# Patient Record
Sex: Male | Born: 1950
Health system: Southern US, Community
[De-identification: ages and names within clinical notes are randomized; demographics above are authoritative.]

## PROBLEM LIST (undated history)

## (undated) ENCOUNTER — Emergency Department (HOSPITAL_COMMUNITY): Admission: EM | Payer: Medicare Other | Source: Home / Self Care

## (undated) DIAGNOSIS — R7303 Prediabetes: Secondary | ICD-10-CM

## (undated) DIAGNOSIS — M1990 Unspecified osteoarthritis, unspecified site: Secondary | ICD-10-CM

## (undated) DIAGNOSIS — I10 Essential (primary) hypertension: Secondary | ICD-10-CM

## (undated) DIAGNOSIS — G8929 Other chronic pain: Secondary | ICD-10-CM

## (undated) DIAGNOSIS — G4730 Sleep apnea, unspecified: Secondary | ICD-10-CM

## (undated) DIAGNOSIS — E039 Hypothyroidism, unspecified: Secondary | ICD-10-CM

## (undated) DIAGNOSIS — E7800 Pure hypercholesterolemia, unspecified: Secondary | ICD-10-CM

## (undated) DIAGNOSIS — M549 Dorsalgia, unspecified: Secondary | ICD-10-CM

## (undated) DIAGNOSIS — F32A Depression, unspecified: Secondary | ICD-10-CM

## (undated) DIAGNOSIS — N2889 Other specified disorders of kidney and ureter: Secondary | ICD-10-CM

## (undated) DIAGNOSIS — D649 Anemia, unspecified: Secondary | ICD-10-CM

## (undated) HISTORY — PX: FOOT SURGERY: SHX648

## (undated) HISTORY — PX: TOTAL THYROIDECTOMY: SHX2547

## (undated) HISTORY — PX: SHOULDER SURGERY: SHX246

## (undated) HISTORY — PX: HAND SURGERY: SHX662

## (undated) HISTORY — PX: OTHER SURGICAL HISTORY: SHX169

---

## 1998-02-07 HISTORY — PX: EYE SURGERY: SHX253

## 2014-03-06 ENCOUNTER — Emergency Department (HOSPITAL_COMMUNITY)
Admission: EM | Admit: 2014-03-06 | Discharge: 2014-03-06 | Disposition: A | Discharge status: Home / Self Care | Attending: Emergency Medicine | Admitting: Emergency Medicine

## 2014-03-06 ENCOUNTER — Emergency Department (HOSPITAL_COMMUNITY)

## 2014-03-06 ENCOUNTER — Encounter (HOSPITAL_COMMUNITY): Payer: Self-pay | Admitting: Emergency Medicine

## 2014-03-06 DIAGNOSIS — Z87891 Personal history of nicotine dependence: Secondary | ICD-10-CM | POA: Insufficient documentation

## 2014-03-06 DIAGNOSIS — I10 Essential (primary) hypertension: Secondary | ICD-10-CM | POA: Diagnosis not present

## 2014-03-06 DIAGNOSIS — R079 Chest pain, unspecified: Secondary | ICD-10-CM | POA: Diagnosis not present

## 2014-03-06 HISTORY — DX: Essential (primary) hypertension: I10

## 2014-03-06 LAB — COMPREHENSIVE METABOLIC PANEL
ALT: 16 U/L (ref 0–53)
AST: 24 U/L (ref 0–37)
Albumin: 4.7 g/dL (ref 3.5–5.2)
Alkaline Phosphatase: 49 U/L (ref 39–117)
Anion gap: 7 (ref 5–15)
BUN: 28 mg/dL — ABNORMAL HIGH (ref 6–23)
CO2: 29 mmol/L (ref 19–32)
Calcium: 9.5 mg/dL (ref 8.4–10.5)
Chloride: 102 mmol/L (ref 96–112)
Creatinine, Ser: 0.98 mg/dL (ref 0.50–1.35)
GFR calc Af Amer: >90 mL/min (ref >90)
GFR calc non Af Amer: 86 mL/min — ABNORMAL LOW (ref >90)
Glucose, Bld: 110 mg/dL — ABNORMAL HIGH (ref 70–99)
Potassium: 3.8 mmol/L (ref 3.5–5.1)
Sodium: 138 mmol/L (ref 135–145)
Total Bilirubin: 0.6 mg/dL (ref 0.3–1.2)
Total Protein: 7.9 g/dL (ref 6.0–8.3)

## 2014-03-06 LAB — CBC WITH DIFFERENTIAL/PLATELET
Basophils Absolute: 0 10*3/uL (ref 0.0–0.1)
Basophils Relative: 0 % (ref 0–1)
Eosinophils Absolute: 0.1 10*3/uL (ref 0.0–0.7)
Eosinophils Relative: 2 % (ref 0–5)
HCT: 38.6 % — ABNORMAL LOW (ref 39.0–52.0)
Hemoglobin: 13.2 g/dL (ref 13.0–17.0)
Lymphocytes Relative: 13 % (ref 12–46)
Lymphs Abs: 0.6 10*3/uL — ABNORMAL LOW (ref 0.7–4.0)
MCH: 31.8 pg (ref 26.0–34.0)
MCHC: 34.2 g/dL (ref 30.0–36.0)
MCV: 93 fL (ref 78.0–100.0)
Monocytes Absolute: 0.3 10*3/uL (ref 0.1–1.0)
Monocytes Relative: 8 % (ref 3–12)
Neutro Abs: 3.2 10*3/uL (ref 1.7–7.7)
Neutrophils Relative %: 77 % (ref 43–77)
Platelets: 148 10*3/uL — ABNORMAL LOW (ref 150–400)
RBC: 4.15 MIL/uL — ABNORMAL LOW (ref 4.22–5.81)
RDW: 13.8 % (ref 11.5–15.5)
WBC: 4.1 10*3/uL (ref 4.0–10.5)

## 2014-03-06 LAB — TROPONIN I: Troponin I: <0.03 ng/mL (ref <0.031)

## 2014-03-06 LAB — LIPASE, BLOOD: Lipase: 34 U/L (ref 11–59)

## 2014-03-06 MED ORDER — KETOROLAC TROMETHAMINE 30 MG/ML IJ SOLN
15.0000 mg | Freq: Once | INTRAMUSCULAR | Status: AC
  Administered 2014-03-06: 15 mg via INTRAVENOUS
  Filled 2014-03-06: qty 1

## 2014-03-06 MED ORDER — SODIUM CHLORIDE 0.9 % IV BOLUS (SEPSIS)
1000.0000 mL | Freq: Once | INTRAVENOUS | Status: AC
  Administered 2014-03-06: 1000 mL via INTRAVENOUS

## 2014-03-06 MED ORDER — IOHEXOL 300 MG/ML  SOLN
80.0000 mL | Freq: Once | INTRAMUSCULAR | Status: AC | PRN
  Administered 2014-03-06: 80 mL via INTRAVENOUS

## 2014-03-06 NOTE — ED Notes (Signed)
Having chest pain for last hour.  Pain increase and SOB on arrival.  Rates pain 7.

## 2014-03-06 NOTE — Discharge Instructions (Signed)

## 2014-03-06 NOTE — ED Notes (Signed)
Discharge instructions reviewed with pt, questions answered. Pt verbalized understanding.  

## 2014-03-06 NOTE — ED Notes (Signed)
Pt assessed by myself, nurse first. Pt c/o of left side chest pain which he states feels like "a heartbeat", pt states pain worse with coughing, deep breathing. Pt in NAD. No concern for acute MI at this time.

## 2014-03-06 NOTE — ED Provider Notes (Signed)
CSN: [id number]     Arrival date & time 03/06/14  1056 History  This chart was scribed for Raeford Razor, MD by Luisa Dago, ED Scribe. This patient was seen in room APA12/APA12 and the patient's care was started at 12:09 PM.    Chief Complaint  Patient presents with  . Chest Pain   Patient is a 64 y.o. male presenting with chest pain. The history is provided by the patient and medical records. No language interpreter was used.  Chest Pain Associated symptoms: no abdominal pain, no back pain, no cough, no fatigue and no headache    HPI Comments: Douglas Hardy is a 64 y.o. male with a PMhx of HTN listed below presents to the Emergency Department complaining of sudden onset gradually worsening left sided chest pain that started 4 hours ago. Pt states that his pain is exacerbated by deep breathing and coughing. Positive associated SOB walking here from the car, but it has currently resolved. He states that when it started he could reproduce the pain by pushing on his chest. Now, states that pain is also worse with rotation of trunk to the left, discomfort not present when he twists to the right. Pain not as bad as when it started, but still present.  Pt endorses a h/o sarcoidosis. Douglas Hardy has not smoked tobacco in 40 plus years. Pt denies fever, neck pain, sore throat, visual disturbance, cough,  abdominal pain, nausea, emesis, diarrhea, urinary symptoms, back pain, HA, weakness, numbness and rash as associated symptoms.    Pt has been checking his BP recently at home. He states that his BP has been fluctuating, from 145/101 down to 60/45. Wife states that he has been feeling dizzy and light-headed. Pt is on 4 medications for his BP.   Past Medical History  Diagnosis Date  . Hypertension    History reviewed. No pertinent past surgical history. Family History  Problem Relation Age of Onset  . Hyperlipidemia Mother   . Hyperlipidemia Father   . Hyperlipidemia Sister   . Cancer Brother   .  Stroke Brother   . Hyperlipidemia Brother    History  Substance Use Topics  . Smoking status: Former Games developer  . Smokeless tobacco: Not on file  . Alcohol Use: 0.6 oz/week    1 Glasses of wine per week    Review of Systems  Constitutional: Negative for appetite change and fatigue.  HENT: Negative for congestion, ear discharge and sinus pressure.   Eyes: Negative for discharge.  Respiratory: Negative for cough.   Cardiovascular: Positive for chest pain.  Gastrointestinal: Negative for abdominal pain and diarrhea.  Genitourinary: Negative for frequency and hematuria.  Musculoskeletal: Negative for back pain.  Skin: Negative for rash.  Neurological: Negative for seizures and headaches.  Psychiatric/Behavioral: Negative for hallucinations.  All other systems reviewed and are negative.     Allergies  Celebrex and Lisinopril  Home Medications   Prior to Admission medications   Not on File   BP 103/53 mmHg  Pulse 84  Temp(Src) 98.8 F (37.1 C)  Resp 18  Ht  (1.702 m)  Wt 179 lb (81.194 kg)  BMI 28.03 kg/m2  SpO2 100%  Physical Exam  Constitutional: He appears well-developed and well-nourished. No distress.  HENT:  Head: Normocephalic and atraumatic.  Eyes: Conjunctivae are normal. Right eye exhibits no discharge. Left eye exhibits no discharge.  Neck: Neck supple.  Cardiovascular: Normal rate, regular rhythm and normal heart sounds.  Exam reveals no gallop and no  friction rub.   No murmur heard. Pulmonary/Chest: Effort normal and breath sounds normal. No respiratory distress.  Chest non-tender. Negative chest wall tenderness. Pain not reproducible with palpation.   Abdominal: Soft. He exhibits no distension. There is no tenderness.  Musculoskeletal: He exhibits no edema or tenderness.  Neurological: He is alert.  Skin: Skin is warm and dry.  Psychiatric: He has a normal mood and affect. His behavior is normal. Thought content normal.  Nursing note and vitals  reviewed.   ED Course  Procedures (including critical care time)  DIAGNOSTIC STUDIES: Oxygen Saturation is 100% on RA, normal by my interpretation.    COORDINATION OF CARE: 12:16 PM- Pt advised of plan for treatment and pt agrees.  Labs Review Labs Reviewed  CBC WITH DIFFERENTIAL/PLATELET - Abnormal; Notable for the following:    RBC 4.15 (*)    HCT 38.6 (*)    Platelets 148 (*)    Lymphs Abs 0.6 (*)    All other components within normal limits  COMPREHENSIVE METABOLIC PANEL - Abnormal; Notable for the following:    Glucose, Bld 110 (*)    BUN 28 (*)    GFR calc non Af Amer 86 (*)    All other components within normal limits  TROPONIN I  LIPASE, BLOOD    Imaging Review No results found.   Dg Chest 2 View  03/06/2014   CLINICAL DATA:  Acute left-sided chest pain for 4 hr. Cough. Shortness of breath.  EXAM: CHEST  2 VIEW  COMPARISON:  None.  FINDINGS: The heart size and mediastinal contours are within normal limits. No evidence of pulmonary infiltrate. No evidence of pneumothorax or pleural effusion.  An asymmetric nodular opacity is seen in the left perihilar region. Small carcinoma cannot be excluded. Mild elevation of left hemidiaphragm noted. The visualized skeletal structures are unremarkable.  IMPRESSION: No acute findings.  Small nodular opacity in left perihilar region. Small carcinoma cannot be excluded. Chest CT with contrast is recommended for further evaluation.   Electronically Signed   By: Myles Rosenthal M.D.   On: 03/06/2014 12:33   Ct Chest W Contrast  03/06/2014   CLINICAL DATA:  Nodular opacity on chest radiograph obtained earlier in the day. Patient complains of chest pain. History of sarcoidosis.  EXAM: CT CHEST WITH CONTRAST  TECHNIQUE: Multidetector CT imaging of the chest was performed during intravenous contrast administration.  CONTRAST:  80mL OMNIPAQUE IOHEXOL 300 MG/ML  SOLN  COMPARISON:  Chest radiograph March 06, 2014  FINDINGS: There is no nodular  opacity in the left perihilar region on CT did correspond to the area of concern on chest radiograph obtained earlier in the day.  On axial slice 20 series 3, there is a 3 mm nodular opacity anterior segment of the right upper lobe. On axial slice 21 series 3, there is a 4 mm nodular opacity abutting the pleura posteriorly in the superior segment of the right lower lobe. On axial slice 22 series 3, there is a 2 mm nodular opacity abutting the pleura in the posterior aspect of the superior segment of the right lower lobe. Also on slice 22, there is a 2 mm nodular opacity abutting the pleura in the medial aspect of the superior segment of the right lower lobe. On axial slice 27 series 3, there are several 2-3 mm nodular opacities in the lateral segment of the right middle lobe. On axial slice 29 series 3, there is a 5 mm nodular opacity in the lateral segment  of the right middle lobe. On axial slice 29 series 3, there is a 3 mm nodular opacity in the superior segment of the left lower lobe. There is no lung edema or consolidation.  There are calcified mildly enlarged lymph nodes in each hilum as well as in the sub- carinal region. The sub- carinal adenopathy measures 3.6 x 2.6 cm. The largest individual lymph node in the left hilar region measures 1.6 by 1.4 cm. The largest individual lymph node in the right hilum measures 2.4 x 2.1 cm. There is a pretracheal mildly enlarged lymph node measuring 2.1 x 1.4 cm with subtle calcification. There is a right peritracheal lymph node measuring 2.6 x 1.5 cm.  Pericardium is not thickened. There is no thoracic aortic aneurysm. No pulmonary embolus is appreciable on this study.  In the visualized upper abdomen, no lesion is seen. There are no blastic or lytic bone lesions. There is a 1 cm nodular area in the right lobe of the thyroid.  IMPRESSION: No mass corresponding to the area question on chest radiograph earlier in the day. Suspect that this opacity in the left perihilar  region represents a prominent peripheral vessel.  There are scattered small pulmonary nodular lesions in the lungs, largest measuring 5 mm. Followup of these nodular lesion should be based on Fleischner Society guidelines. If the patient is at high risk for bronchogenic carcinoma, follow-up chest CT at 6-12 months is recommended. If the patient is at low risk for bronchogenic carcinoma, follow-up chest CT at 12 months is recommended. This recommendation follows the consensus statement: Guidelines for Management of Small Pulmonary Nodules Detected on CT Scans: A Statement from the Fleischner Society as published in Radiology 2005;237:395-400.  Multiple enlarged lymph nodes, many of which are calcified. Suspect adenopathy secondary to sarcoidosis. Particular attention of these lymph nodes on follow-up CT advised. See above recommendation.  Small nodular lesion right lobe of thyroid. A lesion of this size in a patient of this age group does not warrant additional imaging surveillance beyond attention followup CT as per recommended above.   Electronically Signed   By: Bretta BangWilliam  Woodruff M.D.   On: 03/06/2014 13:51    EKG Interpretation   Date/Time:  Thursday March 06 2014 11:04:23 EST Ventricular Rate:  86 PR Interval:  184 QRS Duration: 84 QT Interval:  360 QTC Calculation: 430 R Axis:   46 Text Interpretation:  Sinus rhythm with occasional Premature ventricular  complexes Non-specific ST-t changes Confirmed by Juleen ChinaKOHUT  MD,  ([id number])  on 03/06/2014 2:01:08 PM      MDM   Final diagnoses:  Chest pain, unspecified chest pain type    63yM with CP. Atypical for ACS. Doubt PE, infectious, dissection. Hx of sarcoidosis. CT findings likely represent this.   I personally preformed the services scribed in my presence. The recorded information has been reviewed is accurate. Raeford RazorStephen , MD.    Raeford RazorStephen , MD 03/11/14 [phone]

## 2014-10-15 ENCOUNTER — Encounter (HOSPITAL_COMMUNITY): Payer: Self-pay | Admitting: *Deleted

## 2014-10-15 ENCOUNTER — Emergency Department (HOSPITAL_COMMUNITY)

## 2014-10-15 ENCOUNTER — Emergency Department (HOSPITAL_COMMUNITY)
Admission: EM | Admit: 2014-10-15 | Discharge: 2014-10-15 | Disposition: A | Discharge status: Home / Self Care | Attending: Emergency Medicine | Admitting: Emergency Medicine

## 2014-10-15 DIAGNOSIS — R350 Frequency of micturition: Secondary | ICD-10-CM | POA: Insufficient documentation

## 2014-10-15 DIAGNOSIS — G8929 Other chronic pain: Secondary | ICD-10-CM | POA: Insufficient documentation

## 2014-10-15 DIAGNOSIS — M545 Low back pain, unspecified: Secondary | ICD-10-CM

## 2014-10-15 DIAGNOSIS — R109 Unspecified abdominal pain: Secondary | ICD-10-CM

## 2014-10-15 DIAGNOSIS — M549 Dorsalgia, unspecified: Secondary | ICD-10-CM

## 2014-10-15 DIAGNOSIS — Z7982 Long term (current) use of aspirin: Secondary | ICD-10-CM | POA: Diagnosis not present

## 2014-10-15 DIAGNOSIS — I10 Essential (primary) hypertension: Secondary | ICD-10-CM | POA: Diagnosis not present

## 2014-10-15 DIAGNOSIS — Z79899 Other long term (current) drug therapy: Secondary | ICD-10-CM | POA: Diagnosis not present

## 2014-10-15 DIAGNOSIS — Z87891 Personal history of nicotine dependence: Secondary | ICD-10-CM | POA: Diagnosis not present

## 2014-10-15 HISTORY — DX: Other chronic pain: G89.29

## 2014-10-15 HISTORY — DX: Dorsalgia, unspecified: M54.9

## 2014-10-15 LAB — COMPREHENSIVE METABOLIC PANEL
ALBUMIN: 4 g/dL (ref 3.5–5.0)
ALK PHOS: 40 U/L (ref 38–126)
ALT: 12 U/L — AB (ref 17–63)
ANION GAP: 9 (ref 5–15)
AST: 21 U/L (ref 15–41)
BILIRUBIN TOTAL: 0.8 mg/dL (ref 0.3–1.2)
BUN: 28 mg/dL — ABNORMAL HIGH (ref 6–20)
CALCIUM: 8.7 mg/dL — AB (ref 8.9–10.3)
CO2: 28 mmol/L (ref 22–32)
Chloride: 100 mmol/L — ABNORMAL LOW (ref 101–111)
Creatinine, Ser: 1.34 mg/dL — ABNORMAL HIGH (ref 0.61–1.24)
GFR calc Af Amer: >60 mL/min (ref >60)
GFR calc non Af Amer: 54 mL/min — ABNORMAL LOW (ref >60)
GLUCOSE: 102 mg/dL — AB (ref 65–99)
Potassium: 4 mmol/L (ref 3.5–5.1)
Sodium: 137 mmol/L (ref 135–145)
TOTAL PROTEIN: 6.9 g/dL (ref 6.5–8.1)

## 2014-10-15 LAB — URINALYSIS, ROUTINE W REFLEX MICROSCOPIC
BILIRUBIN URINE: NEGATIVE
GLUCOSE, UA: NEGATIVE mg/dL
HGB URINE DIPSTICK: NEGATIVE
KETONES UR: NEGATIVE mg/dL
Leukocytes, UA: NEGATIVE
Nitrite: NEGATIVE
PROTEIN: NEGATIVE mg/dL
Specific Gravity, Urine: 1.01 (ref 1.005–1.030)
UROBILINOGEN UA: 0.2 mg/dL (ref 0.0–1.0)
pH: 6.5 (ref 5.0–8.0)

## 2014-10-15 LAB — CBC WITH DIFFERENTIAL/PLATELET
BASOS PCT: 0 % (ref 0–1)
Basophils Absolute: 0 10*3/uL (ref 0.0–0.1)
EOS ABS: 0.1 10*3/uL (ref 0.0–0.7)
EOS PCT: 3 % (ref 0–5)
HCT: 35 % — ABNORMAL LOW (ref 39.0–52.0)
Hemoglobin: 12.1 g/dL — ABNORMAL LOW (ref 13.0–17.0)
LYMPHS ABS: 0.6 10*3/uL — AB (ref 0.7–4.0)
Lymphocytes Relative: 18 % (ref 12–46)
MCH: 31.5 pg (ref 26.0–34.0)
MCHC: 34.6 g/dL (ref 30.0–36.0)
MCV: 91.1 fL (ref 78.0–100.0)
MONOS PCT: 13 % — AB (ref 3–12)
Monocytes Absolute: 0.5 10*3/uL (ref 0.1–1.0)
NEUTROS PCT: 66 % (ref 43–77)
Neutro Abs: 2.3 10*3/uL (ref 1.7–7.7)
PLATELETS: 140 10*3/uL — AB (ref 150–400)
RBC: 3.84 MIL/uL — ABNORMAL LOW (ref 4.22–5.81)
RDW: 13.7 % (ref 11.5–15.5)
WBC: 3.5 10*3/uL — ABNORMAL LOW (ref 4.0–10.5)

## 2014-10-15 LAB — LIPASE, BLOOD: Lipase: 20 U/L — ABNORMAL LOW (ref 22–51)

## 2014-10-15 LAB — I-STAT CG4 LACTIC ACID, ED
LACTIC ACID, VENOUS: 1.29 mmol/L (ref 0.5–2.0)
LACTIC ACID, VENOUS: 0.53 mmol/L (ref 0.5–2.0)

## 2014-10-15 LAB — TROPONIN I

## 2014-10-15 MED ORDER — IOHEXOL 300 MG/ML  SOLN
25.0000 mL | Freq: Once | INTRAMUSCULAR | Status: AC | PRN
  Administered 2014-10-15: 25 mL via ORAL

## 2014-10-15 MED ORDER — SODIUM CHLORIDE 0.9 % IV SOLN
INTRAVENOUS | Status: DC
  Administered 2014-10-15: 100 mL/h via INTRAVENOUS

## 2014-10-15 MED ORDER — HYDROCODONE-ACETAMINOPHEN 5-325 MG PO TABS: ORAL_TABLET | ORAL | Status: DC

## 2014-10-15 MED ORDER — METHOCARBAMOL 500 MG PO TABS
500.0000 mg | ORAL_TABLET | Freq: Two times a day (BID) | ORAL | Status: DC | PRN
Start: 2014-10-15 — End: 2018-10-12

## 2014-10-15 MED ORDER — SODIUM CHLORIDE 0.9 % IV BOLUS (SEPSIS)
500.0000 mL | Freq: Once | INTRAVENOUS | Status: AC
Start: 2014-10-15 — End: 2014-10-15
  Administered 2014-10-15: 500 mL via INTRAVENOUS

## 2014-10-15 MED ORDER — IOHEXOL 300 MG/ML  SOLN
100.0000 mL | Freq: Once | INTRAMUSCULAR | Status: AC | PRN
  Administered 2014-10-15: 100 mL via INTRAVENOUS

## 2014-10-15 MED ORDER — SODIUM CHLORIDE 0.9 % IV BOLUS (SEPSIS)
500.0000 mL | Freq: Once | INTRAVENOUS | Status: AC
  Administered 2014-10-15: 500 mL via INTRAVENOUS

## 2014-10-15 MED ORDER — SODIUM CHLORIDE 0.9 % IV BOLUS (SEPSIS)
1000.0000 mL | Freq: Once | INTRAVENOUS | Status: AC
  Administered 2014-10-15: 1000 mL via INTRAVENOUS

## 2014-10-15 NOTE — ED Notes (Signed)
mD at bedside

## 2014-10-15 NOTE — Discharge Instructions (Signed)
°Emergency Department Resource Guide °1) Find a Doctor and Pay Out of Pocket °Although you won't have to find out who is covered by your insurance plan, it is a good idea to ask around and get recommendations. You will then need to call the office and see if the doctor you have chosen will accept you as a new patient and what types of options they offer for patients who are self-pay. Some doctors offer discounts or will set up payment plans for their patients who do not have insurance, but you will need to ask so you aren't surprised when you get to your appointment. ° °2) Contact Your Local Health Department °Not all health departments have doctors that can see patients for sick visits, but many do, so it is worth a call to see if yours does. If you don't know where your local health department is, you can check in your phone book. The CDC also has a tool to help you locate your state's health department, and many state websites also have listings of all of their local health departments. ° °3) Find a Walk-in Clinic °If your illness is not likely to be very severe or complicated, you may want to try a walk in clinic. These are popping up all over the country in pharmacies, drugstores, and shopping centers. They're usually staffed by nurse practitioners or physician assistants that have been trained to treat common illnesses and complaints. They're usually fairly quick and inexpensive. However, if you have serious medical issues or chronic medical problems, these are probably not your best option. ° °No Primary Care Doctor: °- Call [facility] at  832-8000 - they can help you locate a primary care doctor that  accepts your insurance, provides certain services, etc. °- Physician Referral Service- 1-800-533-3463 ° °Chronic Pain Problems: °Organization         Address  Phone   Notes  °[facility]  ([phone]) [phone] Patients need to be referred by their primary care doctor.  ° °Medication  Assistance: °Organization         Address  Phone   Notes  °[place] [facility] [address] °[place], [place] [address] (336) 641-8030 --Must be a resident of [place] °-- Must have NO insurance coverage whatsoever (no Medicaid/ Medicare, etc.) °-- The pt. MUST have a primary care doctor that directs their care regularly and follows them in the community °  °[facility]  (866) 331-1348   °[employer]  (888) 892-1162   ° °Agencies that provide inexpensive medical care: °Organization         Address  Phone   Notes  °[facility]  (336) 832-8035   °[facility]    (336) 832-7272   °[facility] [address] °[place], [place] [address] (336) 832-4777   °[facility] [address], °[place] (336) 271-4999   °[facility]    (336) 373-0678   °[facility]    (336) 272-1050   °[facility] ° [address], [place] Phone:  (336) 832-4444, Fax:  (336) 832-4440 Hours of Operation:  9 am - 6 pm, M-F.  Also accepts Medicaid/Medicare and self-pay.  °[facility] ° [address], [place] Phone: (336) [phone], Fax: (336) [phone]. Hours of Operation:  8:30 am - 5:30 pm, M-F.  Also accepts Medicaid and self-pay.  °[facility] [place] [address], [place] Phone: (336) 878-6027   °[facility] [address], [place], [place] (336)723-1848, Ext. 123 Mondays & Thursdays: 7-9 AM.  First 15 patients are seen on a first come, first serve basis. °  ° °Medicaid-accepting [place] Providers: ° °Organization         Address  Phone   Notes  °[facility] [address], [place] (336) 641-2100 Also accepts self-pay patients.  °[facility] [address], [place] ° (336) 856-9996   °[facility] [address], [place]  (336) 288-8857   °[facility] [address] [place] [address], [place] (336) 299-7000   °Veita Bland [address], [place]  ° (336) 373-1557 Only accepts [place] Access Medicaid patients after they have their name applied to their card.  ° °Self-Pay (no insurance) in [place]: ° °Organization         Address  Phone   Notes  °Sickle Cell Patients, [facility] [address], [place] (336) 832-1970   °[facility] [address], [place] (336) 832-4400   °[facility] ° [address], [place] (336) 992-4800   °[facility]/Dr. Osei-Bonsu ° [address] [place] [address], [place] or [address], [place] (336) [phone] Phone number for both [place] and [place] locations is the same.  °[facility] [address], [place] (336) 299-0000   °[facility] [address] [place] [address], [place] or [address] (336) 852-7530 °(336) 878-2260   °[facility] [address], [place] (336) 350-1642, phone; (336) 294-5005, fax Sees patients 1st and 3rd Saturday of every month.  Must not qualify for public or private insurance (i.e. Medicaid, Medicare, [place] Health Choice, Veterans' Benefits) • Household income should be no more than 200% of the poverty level •The clinic cannot treat you if you are pregnant or think you are pregnant • Sexually transmitted diseases are not treated at the clinic.  ° ° °Dental Care: °Organization         Address  Phone  Notes  °[place] [facility] [address], [place] (336) 641-6152 Accepts children up to age 21 who are enrolled in Medicaid or [place] Health Choice; pregnant women with a Medicaid card; and children who have applied for Medicaid or [place] Health Choice, but were declined, whose parents can pay a reduced fee at time of service.  °[place]  [facility] [place]  [address], [place] (336) 641-7733 Accepts children up to age 21 who are enrolled in Medicaid or [place] Health Choice; pregnant women with a Medicaid card; and children who have applied for Medicaid or [place] Health Choice, but were declined, whose parents can pay a reduced fee at time of service.  °[facility] ° [address], [place] (336) 641-4533 Patients are seen by appointment only. Walk-ins are not accepted. [facility] will see patients 18 years of age and older. °Monday - Tuesday (8am-5pm) °Most Wednesdays (8:30-5pm) °$30 per visit, cash only  °[facility] ° [address], [place] (336) 641-4533 Patients are seen by appointment only. Walk-ins are not accepted. [facility] will see patients 18 years of age and older. °One   Wednesday Evening (Monthly: Volunteer Based).  $30 per visit, cash only  °[facility]  (919) 537-3737 for adults; Children under age 4, call Graduate Pediatric Dentistry at ([phone]) [phone]. Children aged 4-14, please call ([phone]) [phone] to request a pediatric application. ° Dental services are provided in all areas of dental care including fillings, crowns and bridges, complete and partial dentures, implants, gum treatment, root canals, and extractions. Preventive care is also provided. Treatment is provided to both adults and children. °Patients are selected via a lottery and there is often a waiting list. °  °[facility] [address], °[place] ° (336) 763-8833 [email or web address].[email or web address] °  °[facility] [address], [place], [place] (336)723-1848, Ext. 123 Second and Fourth Thursday of each month, opens at 6:30 AM; Clinic ends at 9 AM.  Patients are seen on a first-come first-served basis, and a limited number are seen during each clinic.  ° °[facility] ° [address], [place], [place] (336) 723-7904    Eligibility Requirements °You must have lived in [place], [place], or [place] counties for at least the last three months. °  You cannot be eligible for state or federal sponsored healthcare insurance, including Veterans Administration, Medicaid, or Medicare. °  You generally cannot be eligible for healthcare insurance through your employer.  °  How to apply: °Eligibility screenings are held every Tuesday and Wednesday afternoon from 1:00 pm until 4:00 pm. You do not need an appointment for the interview!  °[facility] [address], [place], [place] 336-631-2330   °[facility]  336-342-8273   °[facility]  336-703-3100   °[facility]  336-570-6415   ° °Behavioral Health Resources in the Community: °Intensive Outpatient Programs °Organization         Address  Phone  Notes  °[facility] [address], [place], [place] 336-878-6098   °[facility] [address], [place], [place] 336-832-9800   °[facility] [address], [place], [place] ° 336-882-2125   °[facility] [address],  °[place], [place] 1-800-853-5163 or 336-641-4981   °Substance Abuse Resources °Organization         Address  Phone  Notes  °[facility]  336-882-2125   °[facility]  336-784-9470   °[facility]  336-285-9073   °[facility]  336-845-3988   °Residential & Outpatient Substance Abuse Program  1-800-659-3381   °Psychological Services °Organization         Address  Phone  Notes  °[facility]  336- 832-9600   °[facility]  336- 378-7881   °[facility] [address], [place] 1-800-853-5163 or 336-641-4981   ° °Mobile Crisis Teams °Organization         Address  Phone  Notes  °[facility], Mobile Crisis Care Unit  1-877-626-1772   °Assertive °[facility] ° [address]  [place], [place] 336-834-9664   °Sharon DeEsch [address] °[place] 336-554-5454   ° °Self-Help/Support Groups °Organization         Address  Phone             Notes  °[facility]  - variety of support groups  336- 373-1402 Call for more information  °Narcotics Anonymous (NA), [facility] [address], °[place]  2 meetings at this location  ° °  Residential Treatment Programs Organization         Address  Phone  Notes  [facility] [address],    [place]  1-610-960-4540   [facility]  [address], [place] [address], [place], [place] 478-295-6213   [facility] [address] [place], [place] 086-578-4696 Admissions: 8am-3pm M-F  [facility] [address],    [place], [place] 295-284-1324   [facility] [address] [place], [place], [place] 401-027-2536   [facility] [address]  [place], [place] 644-034-7425   [facility] Intensive Outpatient [address], [place], [place] 956-387-5643   [facility] [address] [place]  [place], [place] 3-295-188-4166 or 574 420 1151   [facility] [address], [place], [place] 323-557-3220 Accepts Medicaid  [facility] [address]  [place] 2-542-706-2376 Substance Abuse/Addiction Treatment   [facility]         Address  Phone  Notes  [facility]  [phone]   Angie Fava, PhD [address] [place], [place]   502-353-4965 or 838-853-5191   [facility]   [address] [place], [place] 7346793996   [facility] [address], [place], [place] 260-145-7640 Insurance/Medicaid/sponsorship through [facility] and Families [address]                                    [place], [place] [phone] Therapy/tele-psych/case    [facility] [address] [place] [address], [place] [phone]    Dr. Lolly Mustache  803-641-8247   [facility]  [employer] [facility] 1) [address], [place] 2) [address] [place] 3)  [address], [place] [phone] (732)[phone]  209-089-4383   [facility] Child Abuse Hotline 878-801-3519 or 850-346-0007 (After Hours)      Take the prescriptions as directed.  Apply moist heat or ice to the area(s) of discomfort, for 15 minutes at a time, several times per day for the next few days.  Do not fall asleep on a heating or ice pack. Your kidney function tests were mildly elevated today and you received IV fluid. You will need to have your regular medical doctor recheck them this week. Take your blood pressure only once per day, either in the morning after you take your medicine(s) or in the evening before you go to bed, several times this next week.  Always sit quietly for at least 15 minutes before taking your blood pressure.  Keep a diary of your blood pressures to show your doctor at your follow up office visit.  Call your regular medical doctor tomorrow to schedule a follow up appointment in the next 2 days.  Return to the Emergency Department immediately if worsening.

## 2014-10-15 NOTE — ED Notes (Signed)
Pt denies any dizziness, vomiting, syncope, or nausea.  States that he did not realize that his bp was low.  Takes bp meds daily and yesterday his bp was 120 systolic.

## 2014-10-15 NOTE — ED Notes (Signed)
Pt states lower back pain x 4 days. States he began having lower abdominal pain this morning and felt like he needed to have a BM but was unable. BP 78/52 in triage. Pt denies dizziness.

## 2014-10-15 NOTE — ED Notes (Signed)
Pt ambulated to bathroom independently

## 2014-10-15 NOTE — ED Notes (Signed)
Pt ambulated to bathroom 

## 2014-10-15 NOTE — ED Provider Notes (Signed)
CSN: [id number]     Arrival date & time 10/15/14  1247 History   First MD Initiated Contact with Patient 10/15/14 1323     Chief Complaint  Patient presents with  . Abdominal Pain  . Back Pain      HPI  Pt was seen at 1330. Per pt, c/o gradual onset and persistence of constant acute flair of his chronic low back "pain" for the past 4 days. States he "did a lot of walking" the day before his pain started. Denies any change in his usual chronic pain pattern.  Pain worsens with palpation of the area and body position changes. Pt states today he developed lower abd "pain," and "felt like I needed to have a BM but didn't go." Last BM 2 days ago "was normal." Pt endorses he is urinating regularly. Denies incont/retention of bowel or bladder, no saddle anesthesia, no focal motor weakness, no tingling/numbness in extremities, no fevers, no injury, no dysuria/hemturia, no testicular pain/swelling, no CP/SOB, no fevers.      Past Medical History  Diagnosis Date  . Hypertension   . Chronic back pain    Past Surgical History  Procedure Laterality Date  . Shoulder surgery    . Foot surgery     Family History  Problem Relation Age of Onset  . Hyperlipidemia Mother   . Hyperlipidemia Father   . Hyperlipidemia Sister   . Cancer Brother   . Stroke Brother   . Hyperlipidemia Brother    Social History  Substance Use Topics  . Smoking status: Former Games developer  . Smokeless tobacco: None  . Alcohol Use: 0.6 oz/week    1 Glasses of wine per week     Comment: Occ    Review of Systems ROS: Statement: All systems negative except as marked or noted in the HPI; Constitutional: Negative for fever and chills. ; ; Eyes: Negative for eye pain, redness and discharge. ; ; ENMT: Negative for ear pain, hoarseness, nasal congestion, sinus pressure and sore throat. ; ; Cardiovascular: Negative for chest pain, palpitations, diaphoresis, dyspnea and peripheral edema. ; ; Respiratory: Negative for cough, wheezing and  stridor. ; ; Gastrointestinal: +abd pain. Negative for nausea, vomiting, diarrhea, blood in stool, hematemesis, jaundice and rectal bleeding. . ; ; Genitourinary: Negative for dysuria, flank pain and hematuria. ; ; Musculoskeletal: +LBP. Negative for neck pain. Negative for swelling and trauma.; ; Skin: Negative for pruritus, rash, abrasions, blisters, bruising and skin lesion.; ; Neuro: Negative for headache, lightheadedness and neck stiffness. Negative for weakness, altered level of consciousness , altered mental status, extremity weakness, paresthesias, involuntary movement, seizure and syncope.      Allergies  Lisinopril and Celebrex  Home Medications   Prior to Admission medications   Medication Sig Start Date End Date Taking? Authorizing Provider  allopurinol (ZYLOPRIM) 300 MG tablet Take 300 mg by mouth daily.    Historical Provider, MD  aspirin EC 81 MG tablet Take 81 mg by mouth daily.    Historical Provider, MD  chlorthalidone (HYGROTON) 25 MG tablet Take 25 mg by mouth daily.    Historical Provider, MD  doxazosin (CARDURA) 2 MG tablet Take 2 mg by mouth daily.    Historical Provider, MD  HYDROcodone-acetaminophen (NORCO/VICODIN) 5-325 MG per tablet Take 1 tablet by mouth daily.    Historical Provider, MD  losartan (COZAAR) 50 MG tablet Take 50 mg by mouth daily.    Historical Provider, MD  Multiple Vitamin (MULTIVITAMIN WITH MINERALS) TABS tablet Take 1 tablet  by mouth daily.    Historical Provider, MD  NIFEdipine (PROCARDIA XL/ADALAT-CC) 60 MG 24 hr tablet Take 60 mg by mouth daily.    Historical Provider, MD  simvastatin (ZOCOR) 20 MG tablet Take 10 mg by mouth at bedtime.    Historical Provider, MD  tiZANidine (ZANAFLEX) 4 MG tablet Take 4 mg by mouth 2 (two) times daily.    Historical Provider, MD  vitamin B-12 (CYANOCOBALAMIN) 100 MCG tablet Take 100 mcg by mouth daily.    Historical Provider, MD   BP 91/66 mmHg  Pulse 76  Temp(Src) 98.1 F (36.7 C) (Oral)  Resp 16  Ht 5'  7" (1.702 m)  Wt 185 lb (83.915 kg)  BMI 28.97 kg/m2  SpO2 98%   Filed Vitals:   10/15/14 1303 10/15/14 1304 10/15/14 1340 10/15/14 1426  BP: 79/56 78/52 86/68  91/66  Pulse: 66  72 76  Temp: 98.1 F (36.7 C)     TempSrc: Oral     Resp: 16  16   Height: 5\' 7"  (1.702 m)     Weight: 185 lb (83.915 kg)     SpO2: 98%  98%    Filed Vitals:   10/15/14 1549 10/15/14 1550 10/15/14 1600 10/15/14 1630  BP: 103/67  107/70 118/73  Pulse: 69 62 62 67  Temp:      TempSrc:      Resp:  13 11 12   Height:      Weight:      SpO2: 97% 99% 96% 99%   Filed Vitals:   10/15/14 1550 10/15/14 1600 10/15/14 1630 10/15/14 1700  BP:  107/70 118/73 120/75  Pulse: 62 62 67 72  Temp:      TempSrc:      Resp: 13 11 12 12   Height:      Weight:      SpO2: 99% 96% 99% 98%     Physical Exam  1335: Physical examination:  Nursing notes reviewed; Vital signs and O2 SAT reviewed;  Constitutional: Well developed, Well nourished, Well hydrated, In no acute distress; Head:  Normocephalic, atraumatic; Eyes: EOMI, PERRL, No scleral icterus; ENMT: Mouth and pharynx normal, Mucous membranes moist; Neck: Supple, Full range of motion, No lymphadenopathy; Cardiovascular: Regular rate and rhythm, No murmur, rub, or gallop; Respiratory: Breath sounds clear & equal bilaterally, No rales, rhonchi, wheezes.  Speaking full sentences with ease, Normal respiratory effort/excursion; Chest: Nontender, Movement normal; Abdomen: Soft, +mild lower abd tenderness to palp. No rebound or guarding. No palp masses. Nondistended, Normal bowel sounds; Genitourinary: No CVA tenderness; Spine:  No midline CS, TS, LS tenderness. +TTP bilat lower lumbar paraspinal muscles.;; Extremities: Pulses normal, No tenderness, No edema, No calf edema or asymmetry.; Neuro: AA&Ox3, Major CN grossly intact.  Speech clear. No gross focal motor or sensory deficits in extremities. Strength 5/5 equal bilat UE's and LE's, including great toe dorsiflexion.  DTR 2/4  equal bilat UE's and LE's.  No gross sensory deficits.  Neg straight leg raises bilat.; Skin: Color normal, Warm, Dry.   ED Course  Procedures (including critical care time) Labs Review   Imaging Review  I have personally reviewed and evaluated these images and lab results as part of my medical decision-making.   EKG Interpretation   Date/Time:  Wednesday October 15 2014 13:28:54 EDT Ventricular Rate:  57 PR Interval:  228 QRS Duration: 86 QT Interval:  434 QTC Calculation: 422 R Axis:   -5 Text Interpretation:  Sinus bradycardia with 1st degree A-V block  Otherwise  normal ECG When compared with ECG of 03/06/2014 Nonspecific ST  abnormality is no longer Present Confirmed by [facility]  MD, Nicholos Johns  [phone]) on 10/15/2014 1:56:57 PM      MDM  MDM Reviewed: previous chart, nursing note and vitals Reviewed previous: labs and ECG Interpretation: labs, ECG and CT scan     Results for orders placed or performed during the hospital encounter of 10/15/14  Comprehensive metabolic panel  Result Value Ref Range   Sodium 137 135 - 145 mmol/L   Potassium 4.0 3.5 - 5.1 mmol/L   Chloride 100 (L) 101 - 111 mmol/L   CO2 28 22 - 32 mmol/L   Glucose, Bld 102 (H) 65 - 99 mg/dL   BUN 28 (H) 6 - 20 mg/dL   Creatinine, Ser 2.95 (H) 0.61 - 1.24 mg/dL   Calcium 8.7 (L) 8.9 - 10.3 mg/dL   Total Protein 6.9 6.5 - 8.1 g/dL   Albumin 4.0 3.5 - 5.0 g/dL   AST 21 15 - 41 U/L   ALT 12 (L) 17 - 63 U/L   Alkaline Phosphatase 40 38 - 126 U/L   Total Bilirubin 0.8 0.3 - 1.2 mg/dL   GFR calc non Af Amer 54 (L) >60 mL/min   GFR calc Af Amer >60 >60 mL/min   Anion gap 9 5 - 15  Lipase, blood  Result Value Ref Range   Lipase 20 (L) 22 - 51 U/L  CBC with Differential  Result Value Ref Range   WBC 3.5 (L) 4.0 - 10.5 K/uL   RBC 3.84 (L) 4.22 - 5.81 MIL/uL   Hemoglobin 12.1 (L) 13.0 - 17.0 g/dL   HCT 62.1 (L) 30.8 - 65.7 %   MCV 91.1 78.0 - 100.0 fL   MCH 31.5 26.0 - 34.0 pg   MCHC 34.6 30.0 -  36.0 g/dL   RDW 84.6 96.2 - 95.2 %   Platelets 140 (L) 150 - 400 K/uL   Neutrophils Relative % 66 43 - 77 %   Neutro Abs 2.3 1.7 - 7.7 K/uL   Lymphocytes Relative 18 12 - 46 %   Lymphs Abs 0.6 (L) 0.7 - 4.0 K/uL   Monocytes Relative 13 (H) 3 - 12 %   Monocytes Absolute 0.5 0.1 - 1.0 K/uL   Eosinophils Relative 3 0 - 5 %   Eosinophils Absolute 0.1 0.0 - 0.7 K/uL   Basophils Relative 0 0 - 1 %   Basophils Absolute 0.0 0.0 - 0.1 K/uL  Urinalysis, Routine w reflex microscopic  Result Value Ref Range   Color, Urine YELLOW YELLOW   APPearance CLEAR CLEAR   Specific Gravity, Urine 1.010 1.005 - 1.030   pH 6.5 5.0 - 8.0   Glucose, UA NEGATIVE NEGATIVE mg/dL   Hgb urine dipstick NEGATIVE NEGATIVE   Bilirubin Urine NEGATIVE NEGATIVE   Ketones, ur NEGATIVE NEGATIVE mg/dL   Protein, ur NEGATIVE NEGATIVE mg/dL   Urobilinogen, UA 0.2 0.0 - 1.0 mg/dL   Nitrite NEGATIVE NEGATIVE   Leukocytes, UA NEGATIVE NEGATIVE  Troponin I  Result Value Ref Range   Troponin I <0.03 <0.031 ng/mL  I-Stat CG4 Lactic Acid, ED  Result Value Ref Range   Lactic Acid, Venous 1.29 0.5 - 2.0 mmol/L  I-Stat CG4 Lactic Acid, ED  Result Value Ref Range   Lactic Acid, Venous 0.53 0.5 - 2.0 mmol/L   Ct Abdomen Pelvis W Contrast 10/15/2014   CLINICAL DATA:  Low back pain and low abdomen pain for 4 days.  EXAM: CT ABDOMEN AND PELVIS WITH CONTRAST  TECHNIQUE: Multidetector CT imaging of the abdomen and pelvis was performed using the standard protocol following bolus administration of intravenous contrast.  CONTRAST:  25mL OMNIPAQUE IOHEXOL 300 MG/ML SOLN, OMNIPAQUE IOHEXOL 300 MG/ML SOLN  COMPARISON:  CT chest March 06, 2014  FINDINGS: There are several low-density lesions within the liver, largest measures 7 mm probably liver cysts. In the inferior posterior posterior segment right lobe liver, there is 6 mm focal enhancement on image 30. This is nonspecific. The pancreas, gallbladder, spleen, adrenal glands are normal.  The aorta is normal. There is no abdominal lymphadenopathy.  There are multiple simple cysts in both kidneys. In the posterior medial midpole right kidney on image 19, there is a right kidney 1.7 cm lesion with partial nodular enhancement. There is no hydronephrosis bilaterally.  There is no small bowel obstruction or diverticulitis. Moderate bowel content is identified in the colon. The appendix is normal.  Fluid-filled bladder is normal. Pelvic phleboliths are identified. Prostate gland is normal. There is minimal dependent atelectasis of the posterior lung bases. Degenerative joint changes of the spine are noted.  IMPRESSION: There is no small bowel obstruction or diverticulitis. Moderate bowel content is identified throughout colon.  1.7 cm lesion with partial nodular enhancement in the posterior medial midpole right kidney. Further evaluation with a MRI of kidneys on outpatient basis is recommended to assess for neoplasm.   Electronically Signed   By: Sherian Rein M.D.   On: 10/15/2014 15:41    1745:  BP low on arrival; IVF boluses given with improvement. CT scan reassuring. Pt insists he "can't walk" due to the pain in his lower back, but ED RN states pt has walked to the bathroom on his own while in the ED several times. Unable to obtain MRI LS due to "metal in my eyes" per pt. [facility] and that was [facility] he was told. T/C to [facility]. Juel Burrow regarding CT scan above: no neural impingement seen. I have visualized pt climbing off the stretcher on his own, ambulating to the bathroom, and then climbing back onto his stretcher several times during his ED visit. Pt states he wants to go home now. Tx LBP symptomatically, f/u PMD regarding BUN/Cr. Dx and testing d/w pt and family.  Questions answered.  Verb understanding, agreeable to d/c home with outpt f/u.   Samuel Jester, DO 10/17/14 2210

## 2014-10-16 LAB — URINE CULTURE: Culture: NO GROWTH

## 2016-12-20 ENCOUNTER — Emergency Department (HOSPITAL_COMMUNITY)
Admission: EM | Admit: 2016-12-20 | Discharge: 2016-12-20 | Disposition: A | Discharge status: Home / Self Care | Payer: Medicare Other | Attending: Emergency Medicine | Admitting: Emergency Medicine

## 2016-12-20 DIAGNOSIS — N3001 Acute cystitis with hematuria: Secondary | ICD-10-CM

## 2016-12-20 DIAGNOSIS — Z87891 Personal history of nicotine dependence: Secondary | ICD-10-CM | POA: Diagnosis not present

## 2016-12-20 DIAGNOSIS — I10 Essential (primary) hypertension: Secondary | ICD-10-CM | POA: Insufficient documentation

## 2016-12-20 DIAGNOSIS — Z7982 Long term (current) use of aspirin: Secondary | ICD-10-CM | POA: Insufficient documentation

## 2016-12-20 DIAGNOSIS — Z79899 Other long term (current) drug therapy: Secondary | ICD-10-CM | POA: Insufficient documentation

## 2016-12-20 DIAGNOSIS — R300 Dysuria: Secondary | ICD-10-CM | POA: Diagnosis present

## 2016-12-20 LAB — URINALYSIS, ROUTINE W REFLEX MICROSCOPIC
GLUCOSE, UA: NEGATIVE mg/dL
KETONES UR: NEGATIVE mg/dL
Nitrite: POSITIVE — AB
PH: 5 (ref 5.0–8.0)
PROTEIN: 100 mg/dL — AB
Specific Gravity, Urine: 1.028 (ref 1.005–1.030)

## 2016-12-20 MED ORDER — CEPHALEXIN 500 MG PO CAPS: 500.0000 mg | ORAL_CAPSULE | Freq: Four times a day (QID) | ORAL | 0 refills | Status: DC

## 2016-12-20 NOTE — ED Triage Notes (Signed)
Pt reports he had a foley catheter last month d/t surgery and has had burning ever since. Also reports frequency and back pain.

## 2016-12-20 NOTE — Discharge Instructions (Signed)
Drink plenty of water.  Take the antibiotic as directed until its finished.  Follow-up with your doctor for recheck or return here for any worsening symptoms.  You can also contact one of the providers listed to establish primary care  [facility] Doctor List    Kari BaarsEdward Hawkins MD. Specialty: Pulmonary Disease Contact information: [address]  [address]  [place] [address]  [phone]   Syliva OvermanMargaret Simpson, MD. Specialty: [facility] Contact information: [address]  [place] [address]  [phone]   Lilyan PuntScott Luking, MD. Specialty: [facility] Contact information: [address]  [address]  [place] [address]  [phone]   Avon Gullyesfaye Fanta, MD Specialty: Internal Medicine Contact information: [address]   [place] [address]  [phone]   Catalina PizzaZach Hall, MD. Specialty: Internal Medicine Contact information: [address]  [place] [address]  [phone]    [facility] (Dr. Selena BattenKim) Specialty: Family Medicine Contact information: [address]  [place] [address]  [phone]   John GiovanniStephen Knowlton, MD. Specialty: [facility] Contact information: [address]  [address]  [place] [address]  [phone]   Carylon Perchesoy Fagan, MD. Specialty: Internal Medicine Contact information: [address]  [address]  [place] [address]  [phone]    [facility]  [address] [place], [place] [address] [phone]  Services [facility] offers a variety of basic health services.  Services include but are not limited to: Blood pressure checks  Heart rate checks  Blood sugar checks  Urine analysis  Rapid strep tests  Pregnancy tests.  Health education and referrals  People needing more complex services will be directed to a physician online. Using these virtual visits, doctors can evaluate and prescribe medicine and  treatments. There will be no medication on-site, though [facility] will help patients fill their prescriptions at little to no cost.   For More information please go to: [email or web address]

## 2016-12-22 NOTE — ED Provider Notes (Signed)
[facility] EMERGENCY DEPARTMENT Provider Note   CSN: [id number] Arrival date & time: 12/20/16  1214     History   Chief Complaint Chief Complaint  Patient presents with  . Dysuria    HPI Douglas Hardy is a 66 y.o. male.  HPI  Douglas Hardy is a 66 y.o. male who presents to the Emergency Department complaining of burning with urination for one month.  States that he had a foley catheter placed during a surgery one month ago and he developed burning with urination after the catheter was removed and has been persistent since that time. Associated with urinary frequency. He denies hematuria, abd pain, fever, chills, and vomiting. He denies pain or swelling of his testicles, penile discharge or genital lesions.   Past Medical History:  Diagnosis Date  . Chronic back pain   . Hypertension     There are no active problems to display for this patient.   Past Surgical History:  Procedure Laterality Date  . FOOT SURGERY    . SHOULDER SURGERY         Home Medications    Prior to Admission medications   Medication Sig Start Date End Date Taking? Authorizing Provider  allopurinol (ZYLOPRIM) 300 MG tablet Take 300 mg by mouth daily.    [provider]  aspirin EC 81 MG tablet Take 81 mg by mouth daily.    [provider]  cephALEXin (KEFLEX) 500 MG capsule Take 1 capsule (500 mg total) 4 (four) times daily by mouth. 12/20/16   , , PA-C  chlorthalidone (HYGROTON) 25 MG tablet Take 25 mg by mouth daily.    [provider]  doxazosin (CARDURA) 2 MG tablet Take 2 mg by mouth at bedtime.     [provider]  HYDROcodone-acetaminophen (NORCO/VICODIN) 5-325 MG per tablet 1 tab PO q12 hours prn pain 10/15/14   Samuel JesterMcManus, Kathleen, DO  ibuprofen (ADVIL,MOTRIN) 600 MG tablet Take 600 mg by mouth 3 (three) times daily as needed for mild pain.    [provider]  losartan (COZAAR) 50 MG tablet Take 50 mg by mouth daily.    [provider]  methocarbamol (ROBAXIN) 500 MG tablet Take 1 tablet (500 mg total) by mouth 2 (two) times daily as needed for muscle spasms. 10/15/14   Samuel JesterMcManus, Kathleen, DO  Multiple Vitamin (MULTIVITAMIN WITH MINERALS) TABS tablet Take 1 tablet by mouth daily.    [provider]  NIFEdipine (PROCARDIA XL/ADALAT-CC) 60 MG 24 hr tablet Take 60 mg by mouth daily.    [provider]  simvastatin (ZOCOR) 20 MG tablet Take 10 mg by mouth at bedtime.    [provider]  tiZANidine (ZANAFLEX) 4 MG tablet Take 2 mg by mouth at bedtime.     [provider]  vitamin B-12 (CYANOCOBALAMIN) 100 MCG tablet Take 100 mcg by mouth daily.    [provider]    Family History Family History  Problem Relation Age of Onset  . Hyperlipidemia Mother   . Hyperlipidemia Father   . Hyperlipidemia Sister   . Cancer Brother   . Stroke Brother   . Hyperlipidemia Brother     Social History Social History   Tobacco Use  . Smoking status: Former Smoker  Substance Use Topics  . Alcohol use: Yes    Alcohol/week: 0.6 oz    Types: 1 Glasses of wine per week    Comment: Occ  . Drug use: No     Allergies  Lisinopril and Celebrex [celecoxib]   Review of Systems Review of Systems  Constitutional: Negative for activity change, appetite change, chills and fever.  Respiratory: Negative for chest tightness and shortness of breath.   Gastrointestinal: Negative for abdominal pain, nausea and vomiting.  Genitourinary: Positive for dysuria, frequency and urgency. Negative for decreased urine volume, difficulty urinating, discharge, flank pain, genital sores, hematuria, penile pain, scrotal swelling and testicular pain.  Musculoskeletal: Negative for back pain.  Skin: Negative for rash.  Neurological: Negative for dizziness, weakness and numbness.  Hematological: Negative for adenopathy.  Psychiatric/Behavioral: Negative for confusion.  All other systems reviewed and are  negative.    Physical Exam Updated Vital Signs BP 132/80 (BP Location: Right Arm)   Pulse 91   Temp 98.9 F (37.2 C) (Oral)   Resp 18   Ht 5\' 9"  (1.753 m)   Wt 81.6 kg (180 lb)   SpO2 95%   BMI 26.58 kg/m   Physical Exam  Constitutional: He is oriented to person, place, and time. He appears well-developed and well-nourished. No distress.  HENT:  Head: Normocephalic and atraumatic.  Cardiovascular: Normal rate, regular rhythm, normal heart sounds and intact distal pulses.  No murmur heard. Pulmonary/Chest: Effort normal and breath sounds normal. No respiratory distress. He has no wheezes. He has no rales.  Abdominal: Soft. Normal appearance. He exhibits no distension and no mass. There is no hepatosplenomegaly. There is no tenderness. There is no rigidity, no rebound, no guarding, no CVA tenderness and no tenderness at McBurney's point.  No CVA tenderness  Musculoskeletal: Normal range of motion. He exhibits no edema.  Neurological: He is alert and oriented to person, place, and time. No sensory deficit. Coordination normal.  Skin: Skin is warm and dry. No rash noted.  Nursing note and vitals reviewed.    ED Treatments / Results  Labs (all labs ordered are listed, but only abnormal results are displayed) Labs Reviewed  URINE CULTURE - Abnormal; Notable for the following components:      Result Value   Culture   (*)    Value: >=100,000 COLONIES/mL ESCHERICHIA COLI SUSCEPTIBILITIES TO FOLLOW Performed at [facility], [address], [place], [place] [address]    All other components within normal limits  URINALYSIS, ROUTINE W REFLEX MICROSCOPIC - Abnormal; Notable for the following components:   Color, Urine AMBER (*)    APPearance CLOUDY (*)    Hgb urine dipstick LARGE (*)    Bilirubin Urine SMALL (*)    Protein, ur 100 (*)    Nitrite POSITIVE (*)    Leukocytes, UA MODERATE (*)    Bacteria, UA RARE (*)    Squamous Epithelial / LPF 0-5 (*)    Non Squamous  Epithelial 0-5 (*)    All other components within normal limits    EKG  EKG Interpretation None       Radiology No results found.  Procedures Procedures (including critical care time)  Medications Ordered in ED Medications - No data to display   Initial Impression / Assessment and Plan / ED Course  I have reviewed the triage vital signs and the nursing notes.  Pertinent labs & imaging results that were available during my care of the patient were reviewed by me and considered in my medical decision making (see chart for details).    Pt well appearing.  No abd pain. No concerning sx's for pyelonephritis.  Will rx Kelfex.  Pt appears stable for d/c, return precautions discussed.  Final  Clinical Impressions(s) / ED Diagnoses   Final diagnoses:  Acute cystitis with hematuria    ED Discharge Orders        Ordered    cephALEXin (KEFLEX) 500 MG capsule  4 times daily     12/20/16 1536       Pauline Ausriplett, , PA-C 12/22/16 2032    Loren RacerYelverton, David, MD 12/22/16 2153

## 2016-12-23 LAB — URINE CULTURE
Culture: >=100000 — AB
Special Requests: NORMAL

## 2016-12-24 ENCOUNTER — Telehealth: Payer: Self-pay

## 2016-12-24 NOTE — Telephone Encounter (Signed)
Post ED Visit - Positive Culture Follow-up  Culture report reviewed by antimicrobial stewardship pharmacist:  []  Enzo BiNathan Batchelder, Pharm.D. []  Celedonio MiyamotoJeremy Frens, [address] BoulevardPharm.D., BCPS AQ-ID []  Garvin FilaMike Maccia, Pharm.D., BCPS []  Georgina PillionElizabeth Martin, Pharm.D., BCPS []  AvenelMinh Pham, [place]D., BCPS, AAHIVP []  Estella HuskMichelle Turner, Pharm.D., BCPS, AAHIVP [x]  Lysle Pearlachel Rumbarger, PharmD, BCPS []  Casilda Carlsaylor Stone, PharmD, BCPS []  Pollyann SamplesAndy Johnston, PharmD, BCPS  Positive urine culture Treated with Cephalexin, organism sensitive to the same and no further patient follow-up is required at this time.  Jerry CarasCullom,  [place] 12/24/2016, 11:07 AM

## 2017-01-23 DIAGNOSIS — F5101 Primary insomnia: Secondary | ICD-10-CM | POA: Diagnosis not present

## 2017-01-23 DIAGNOSIS — E782 Mixed hyperlipidemia: Secondary | ICD-10-CM | POA: Diagnosis not present

## 2017-01-23 DIAGNOSIS — I10 Essential (primary) hypertension: Secondary | ICD-10-CM | POA: Diagnosis not present

## 2017-01-23 DIAGNOSIS — E039 Hypothyroidism, unspecified: Secondary | ICD-10-CM | POA: Diagnosis not present

## 2017-02-09 DIAGNOSIS — E039 Hypothyroidism, unspecified: Secondary | ICD-10-CM | POA: Diagnosis not present

## 2017-02-09 DIAGNOSIS — Z6830 Body mass index (BMI) 30.0-30.9, adult: Secondary | ICD-10-CM | POA: Diagnosis not present

## 2017-02-09 DIAGNOSIS — I10 Essential (primary) hypertension: Secondary | ICD-10-CM | POA: Diagnosis not present

## 2017-02-09 DIAGNOSIS — N400 Enlarged prostate without lower urinary tract symptoms: Secondary | ICD-10-CM | POA: Diagnosis not present

## 2017-02-09 DIAGNOSIS — M545 Low back pain: Secondary | ICD-10-CM | POA: Diagnosis not present

## 2017-07-18 DIAGNOSIS — M419 Scoliosis, unspecified: Secondary | ICD-10-CM | POA: Diagnosis not present

## 2017-07-18 DIAGNOSIS — M519 Unspecified thoracic, thoracolumbar and lumbosacral intervertebral disc disorder: Secondary | ICD-10-CM | POA: Diagnosis not present

## 2017-07-18 DIAGNOSIS — M47814 Spondylosis without myelopathy or radiculopathy, thoracic region: Secondary | ICD-10-CM | POA: Diagnosis not present

## 2017-07-20 DIAGNOSIS — M533 Sacrococcygeal disorders, not elsewhere classified: Secondary | ICD-10-CM | POA: Insufficient documentation

## 2017-07-20 DIAGNOSIS — M5137 Other intervertebral disc degeneration, lumbosacral region: Secondary | ICD-10-CM | POA: Insufficient documentation

## 2018-02-12 DIAGNOSIS — N400 Enlarged prostate without lower urinary tract symptoms: Secondary | ICD-10-CM | POA: Diagnosis not present

## 2018-02-12 DIAGNOSIS — M545 Low back pain: Secondary | ICD-10-CM | POA: Diagnosis not present

## 2018-02-12 DIAGNOSIS — R634 Abnormal weight loss: Secondary | ICD-10-CM | POA: Diagnosis not present

## 2018-02-12 DIAGNOSIS — I10 Essential (primary) hypertension: Secondary | ICD-10-CM | POA: Diagnosis not present

## 2018-02-12 DIAGNOSIS — G4730 Sleep apnea, unspecified: Secondary | ICD-10-CM | POA: Diagnosis not present

## 2018-02-12 DIAGNOSIS — E039 Hypothyroidism, unspecified: Secondary | ICD-10-CM | POA: Diagnosis not present

## 2018-02-12 DIAGNOSIS — M25511 Pain in right shoulder: Secondary | ICD-10-CM | POA: Diagnosis not present

## 2018-02-12 DIAGNOSIS — G4700 Insomnia, unspecified: Secondary | ICD-10-CM | POA: Diagnosis not present

## 2018-09-26 DIAGNOSIS — M75100 Unspecified rotator cuff tear or rupture of unspecified shoulder, not specified as traumatic: Secondary | ICD-10-CM | POA: Insufficient documentation

## 2018-10-17 NOTE — Patient Instructions (Addendum)
DUE TO COVID-19 ONLY ONE VISITOR IS ALLOWED TO COME WITH YOU AND STAY IN THE WAITING ROOM ONLY DURING PRE OP AND PROCEDURE DAY OF SURGERY. THE 1 VISITOR MAY VISIT WITH YOU AFTER SURGERY IN YOUR PRIVATE ROOM DURING VISITING HOURS ONLY!  YOU NEED TO HAVE A COVID 19 TEST ON__Monday 09/14/2020_____ @___0840  am____, THIS TEST MUST BE DONE BEFORE SURGERY, COME  [address], [place] , [id number].  [facility]) ONCE YOUR COVID TEST IS COMPLETED, PLEASE BEGIN THE QUARANTINE INSTRUCTIONS AS OUTLINED IN YOUR HANDOUT.                Douglas Hardy  10/17/2018   Your procedure is scheduled on: Thursday 10/25/2018   [facility]  [facility]              Report to admitting at  1005 AM     Call this number if you have problems the morning of surgery [phone]    Remember: Do not eat food  :After Midnight.              NO SOLID FOOD AFTER MIDNIGHT THE NIGHT PRIOR TO SURGERY. NOTHING BY MOUTH EXCEPT CLEAR LIQUIDS FROM MIDNIGHT UP  UNTIL 0935 am .               PLEASE FINISH ENSURE DRINK PER SURGEON ORDER  WHICH NEEDS TO BE COMPLETED AT 0935 am .   CLEAR LIQUID DIET   Foods Allowed                                                                     Foods Excluded  Coffee and tea, regular and decaf                             liquids that you cannot  Plain Jell-O any favor except red or purple                                           see through such as: Fruit ices (not with fruit pulp)                                     milk, soups, orange juice  Iced Popsicles                                    All solid food Carbonated beverages, regular and diet                                    Cranberry, grape and apple juices Sports drinks like Gatorade Lightly seasoned clear broth or consume(fat free) Sugar, honey syrup  Sample Menu Breakfast  Lunch                                     Supper Cranberry juice                     Beef broth                            Chicken broth Jell-O                                     Grape juice                           Apple juice Coffee or tea                        Jell-O                                      Popsicle                                                Coffee or tea                        Coffee or tea  _____________________________________________________________________                BRUSH YOUR TEETH MORNING OF SURGERY AND RINSE YOUR MOUTH OUT, NO CHEWING GUM CANDY OR MINTS.     Take these medicines the morning of surgery with A SIP OF WATER: Gabapentin (Neurontin), Levothyroxine (Synthroid)                                 You may not have any metal on your body including hair pins and              piercings  Do not wear jewelry, make-up, lotions, powders or perfumes, deodorant                        Men may shave face and neck.   Do not bring valuables to the hospital. [place].  Contacts, dentures or bridgework may not be worn into surgery.  Leave suitcase in the car. After surgery it may be brought to your room.                  Please read over the following fact sheets you were given: _____________________________________________________________________             [facility] - Preparing for Surgery Before surgery, you can play an important role.  Because skin is not sterile, your skin needs to be as free of germs as possible.  You can reduce the number of germs on your skin by washing with CHG (chlorahexidine gluconate) soap before surgery.  CHG is an antiseptic cleaner which kills germs and bonds with the skin to continue killing germs even after washing. Please DO NOT use if you have an allergy to CHG or antibacterial soaps.  If your skin becomes reddened/irritated stop using the CHG and inform your nurse when you arrive at Short Stay. Do not shave (including legs and underarms) for at  least 48 hours prior to the first CHG shower.  You may shave your face/neck. Please follow these instructions carefully:  1.  Shower with CHG Soap the night before surgery and the  morning of Surgery.  2.  If you choose to wash your hair, wash your hair first as usual with your  normal  shampoo.  3.  After you shampoo, rinse your hair and body thoroughly to remove the  shampoo.                           4.  Use CHG as you would any other liquid soap.  You can apply chg directly  to the skin and wash                       Gently with a scrungie or clean washcloth.  5.  Apply the CHG Soap to your body ONLY FROM THE NECK DOWN.   Do not use on face/ open                           Wound or open sores. Avoid contact with eyes, ears mouth and genitals (private parts).                       Wash face,  Genitals (private parts) with your normal soap.             6.  Wash thoroughly, paying special attention to the area where your surgery  will be performed.  7.  Thoroughly rinse your body with warm water from the neck down.  8.  DO NOT shower/wash with your normal soap after using and rinsing off  the CHG Soap.                9.  Pat yourself dry with a clean towel.            10.  Wear clean pajamas.            11.  Place clean sheets on your bed the night of your first shower and do not  sleep with pets. Day of Surgery : Do not apply any lotions/deodorants the morning of surgery.  Please wear clean clothes to the hospital/surgery center.  FAILURE TO FOLLOW THESE INSTRUCTIONS MAY RESULT IN THE CANCELLATION OF YOUR SURGERY PATIENT SIGNATURE_________________________________  NURSE SIGNATURE__________________________________  ________________________________________________________________________   Douglas Hardy  An incentive spirometer is a tool that can help keep your lungs clear and active. This tool measures how well you are filling your lungs with each breath. Taking long deep breaths  may help reverse or decrease the chance of developing breathing (pulmonary) problems (especially infection) following:  A long period of time when you are unable to move or be active. BEFORE THE PROCEDURE   If the spirometer includes an indicator to show your best effort, your nurse or respiratory therapist will set it to a desired goal.  If possible, sit up straight or lean slightly forward. Try not to slouch.  Hold the incentive spirometer in an upright position. INSTRUCTIONS FOR USE  1. Sit on the edge of your bed if possible, or sit up as far as you can in bed or on a chair. 2. Hold the incentive spirometer in an upright position. 3. Breathe out normally. 4. Place the mouthpiece in your mouth and seal your lips tightly around it. 5. Breathe in slowly and as deeply as possible, raising the piston or the ball toward the top of the column. 6. Hold your breath for 3-5 seconds or for as long as possible. Allow the piston or ball to fall to the bottom of the column. 7. Remove the mouthpiece from your mouth and breathe out normally. 8. Rest for a few seconds and repeat Steps 1 through 7 at least 10 times every 1-2 hours when you are awake. Take your time and take a few normal breaths between deep breaths. 9. The spirometer may include an indicator to show your best effort. Use the indicator as a goal to work toward during each repetition. 10. After each set of 10 deep breaths, practice coughing to be sure your lungs are clear. If you have an incision (the cut made at the time of surgery), support your incision when coughing by placing a pillow or rolled up towels firmly against it. Once you are able to get out of bed, walk around indoors and cough well. You may stop using the incentive spirometer when instructed by your caregiver.  RISKS AND COMPLICATIONS  Take your time so you do not get dizzy or light-headed.  If you are in pain, you may need to take or ask for pain medication before doing  incentive spirometry. It is harder to take a deep breath if you are having pain. AFTER USE  Rest and breathe slowly and easily.  It can be helpful to keep track of a log of your progress. Your caregiver can provide you with a simple table to help with this. If you are using the spirometer at home, follow these instructions: SEEK MEDICAL CARE IF:   You are having difficultly using the spirometer.  You have trouble using the spirometer as often as instructed.  Your pain medication is not giving enough relief while using the spirometer.  You develop fever of 100.5 F (38.1 C) or higher. SEEK IMMEDIATE MEDICAL CARE IF:   You cough up bloody sputum that had not been present before.  You develop fever of 102 F (38.9 C) or greater.  You develop worsening pain at or near the incision site. MAKE SURE YOU:   Understand these instructions.  Will watch your condition.  Will get help right away if you are not doing well or get worse. Document Released: 06/06/2006 Document Revised: 04/18/2011 Document Reviewed: 08/07/2006 [facility] Patient Information 2014 [place].   ________________________________________________________________________

## 2018-10-18 ENCOUNTER — Encounter (INDEPENDENT_AMBULATORY_CARE_PROVIDER_SITE_OTHER): Payer: Self-pay

## 2018-10-18 ENCOUNTER — Other Ambulatory Visit: Payer: Self-pay

## 2018-10-18 ENCOUNTER — Encounter (HOSPITAL_COMMUNITY): Payer: Self-pay

## 2018-10-18 ENCOUNTER — Encounter (HOSPITAL_COMMUNITY)
Admission: RE | Admit: 2018-10-18 | Discharge: 2018-10-18 | Disposition: A | Discharge status: Home / Self Care | Payer: No Typology Code available for payment source | Source: Ambulatory Visit | Attending: Orthopedic Surgery | Admitting: Orthopedic Surgery

## 2018-10-18 DIAGNOSIS — Z01812 Encounter for preprocedural laboratory examination: Secondary | ICD-10-CM | POA: Diagnosis not present

## 2018-10-18 DIAGNOSIS — I10 Essential (primary) hypertension: Secondary | ICD-10-CM | POA: Insufficient documentation

## 2018-10-18 HISTORY — DX: Unspecified osteoarthritis, unspecified site: M19.90

## 2018-10-18 HISTORY — DX: Sleep apnea, unspecified: G47.30

## 2018-10-18 LAB — BASIC METABOLIC PANEL
Anion gap: 9 (ref 5–15)
BUN: 13 mg/dL (ref 8–23)
CO2: 27 mmol/L (ref 22–32)
Calcium: 9 mg/dL (ref 8.9–10.3)
Chloride: 103 mmol/L (ref 98–111)
Creatinine, Ser: 0.91 mg/dL (ref 0.61–1.24)
GFR calc Af Amer: >60 mL/min (ref >60)
GFR calc non Af Amer: >60 mL/min (ref >60)
Glucose, Bld: 97 mg/dL (ref 70–99)
Potassium: 4.7 mmol/L (ref 3.5–5.1)
Sodium: 139 mmol/L (ref 135–145)

## 2018-10-18 LAB — CBC
HCT: 39 % (ref 39.0–52.0)
Hemoglobin: 13.2 g/dL (ref 13.0–17.0)
MCH: 32 pg (ref 26.0–34.0)
MCHC: 33.8 g/dL (ref 30.0–36.0)
MCV: 94.7 fL (ref 80.0–100.0)
Platelets: 123 10*3/uL — ABNORMAL LOW (ref 150–400)
RBC: 4.12 MIL/uL — ABNORMAL LOW (ref 4.22–5.81)
RDW: 12.9 % (ref 11.5–15.5)
WBC: 2.6 10*3/uL — ABNORMAL LOW (ref 4.0–10.5)
nRBC: 0 % (ref 0.0–0.2)

## 2018-10-18 LAB — SURGICAL PCR SCREEN
MRSA, PCR: NEGATIVE
Staphylococcus aureus: NEGATIVE

## 2018-10-18 NOTE — Progress Notes (Signed)
PCP - Dr. Ollen Barges  [facility]18/2018  Cardiologist - N/A  [facility]     This year- Faxed request for information 10/18/2018  EKG - 10/18/2018  Stress Test - many years ago at [facility] request for information 10/18/2018  ECHO -N/A  Cardiac Cath - N/A  Sleep Study - 2-3 years ago Home Study CPAP - yes, does not know setting  Fasting Blood Sugar - N/A Checks Blood Sugar _____ times a day  Blood Thinner Instructions: Aspirin Instructions:N/A Last Dose:  Anesthesia review:   Chart given to [name], PA for review  Patient denies shortness of breath, fever, cough and chest pain at PAT appointment   Patient verbalized understanding of instructions that were given to them at the PAT appointment. Patient was also instructed that they will need to review over the PAT instructions again at home before surgery.

## 2018-10-22 ENCOUNTER — Other Ambulatory Visit (HOSPITAL_COMMUNITY)
Admission: RE | Admit: 2018-10-22 | Discharge: 2018-10-22 | Disposition: A | Discharge status: Home / Self Care | Payer: Medicare Other | Source: Ambulatory Visit | Attending: Orthopedic Surgery | Admitting: Orthopedic Surgery

## 2018-10-22 DIAGNOSIS — Z20828 Contact with and (suspected) exposure to other viral communicable diseases: Secondary | ICD-10-CM | POA: Insufficient documentation

## 2018-10-22 DIAGNOSIS — Z01812 Encounter for preprocedural laboratory examination: Secondary | ICD-10-CM | POA: Insufficient documentation

## 2018-10-23 LAB — NOVEL CORONAVIRUS, NAA (HOSP ORDER, SEND-OUT TO REF LAB; TAT 18-24 HRS): SARS-CoV-2, NAA: NOT DETECTED

## 2018-10-25 ENCOUNTER — Other Ambulatory Visit: Payer: Self-pay

## 2018-10-25 ENCOUNTER — Inpatient Hospital Stay (HOSPITAL_COMMUNITY)
Admission: RE | Admit: 2018-10-25 | Discharge: 2018-10-26 | DRG: 483 | Disposition: A | Discharge status: Home / Self Care | Payer: No Typology Code available for payment source | Attending: Orthopedic Surgery | Admitting: Orthopedic Surgery

## 2018-10-25 ENCOUNTER — Encounter (HOSPITAL_COMMUNITY): Payer: Self-pay | Admitting: *Deleted

## 2018-10-25 ENCOUNTER — Inpatient Hospital Stay (HOSPITAL_COMMUNITY): Payer: No Typology Code available for payment source | Admitting: Physician Assistant

## 2018-10-25 ENCOUNTER — Inpatient Hospital Stay (HOSPITAL_COMMUNITY): Payer: No Typology Code available for payment source | Admitting: Certified Registered"

## 2018-10-25 ENCOUNTER — Encounter (HOSPITAL_COMMUNITY)
Admission: RE | Disposition: A | Discharge status: Home / Self Care | Payer: Self-pay | Source: Home / Self Care | Attending: Orthopedic Surgery

## 2018-10-25 DIAGNOSIS — M1990 Unspecified osteoarthritis, unspecified site: Secondary | ICD-10-CM | POA: Diagnosis present

## 2018-10-25 DIAGNOSIS — Z79890 Hormone replacement therapy (postmenopausal): Secondary | ICD-10-CM

## 2018-10-25 DIAGNOSIS — M75101 Unspecified rotator cuff tear or rupture of right shoulder, not specified as traumatic: Secondary | ICD-10-CM | POA: Diagnosis present

## 2018-10-25 DIAGNOSIS — Z87891 Personal history of nicotine dependence: Secondary | ICD-10-CM

## 2018-10-25 DIAGNOSIS — Z79899 Other long term (current) drug therapy: Secondary | ICD-10-CM

## 2018-10-25 DIAGNOSIS — G4730 Sleep apnea, unspecified: Secondary | ICD-10-CM | POA: Diagnosis present

## 2018-10-25 DIAGNOSIS — E890 Postprocedural hypothyroidism: Secondary | ICD-10-CM | POA: Diagnosis present

## 2018-10-25 DIAGNOSIS — Z823 Family history of stroke: Secondary | ICD-10-CM

## 2018-10-25 DIAGNOSIS — Z8349 Family history of other endocrine, nutritional and metabolic diseases: Secondary | ICD-10-CM | POA: Diagnosis not present

## 2018-10-25 DIAGNOSIS — Z96611 Presence of right artificial shoulder joint: Secondary | ICD-10-CM

## 2018-10-25 DIAGNOSIS — I10 Essential (primary) hypertension: Secondary | ICD-10-CM | POA: Diagnosis present

## 2018-10-25 DIAGNOSIS — M549 Dorsalgia, unspecified: Secondary | ICD-10-CM | POA: Diagnosis present

## 2018-10-25 DIAGNOSIS — Z79891 Long term (current) use of opiate analgesic: Secondary | ICD-10-CM

## 2018-10-25 DIAGNOSIS — G8929 Other chronic pain: Secondary | ICD-10-CM | POA: Diagnosis present

## 2018-10-25 HISTORY — PX: REVERSE SHOULDER ARTHROPLASTY: SHX5054

## 2018-10-25 SURGERY — ARTHROPLASTY, SHOULDER, TOTAL, REVERSE
Anesthesia: General | Site: Shoulder | Laterality: Right

## 2018-10-25 MED ORDER — OXYCODONE HCL 5 MG PO TABS
10.0000 mg | ORAL_TABLET | ORAL | Status: DC | PRN
  Administered 2018-10-26 (×2): 10 mg via ORAL
  Filled 2018-10-25 (×3): qty 2

## 2018-10-25 MED ORDER — FENTANYL CITRATE (PF) 250 MCG/5ML IJ SOLN
INTRAMUSCULAR | Status: DC | PRN
  Administered 2018-10-25: 50 ug via INTRAVENOUS

## 2018-10-25 MED ORDER — PHENOL 1.4 % MT LIQD: 1.0000 | OROMUCOSAL | Status: DC | PRN

## 2018-10-25 MED ORDER — EPHEDRINE 5 MG/ML INJ
INTRAVENOUS | Status: AC
  Filled 2018-10-25: qty 20

## 2018-10-25 MED ORDER — CEFAZOLIN SODIUM-DEXTROSE 2-4 GM/100ML-% IV SOLN
2.0000 g | INTRAVENOUS | Status: AC
  Administered 2018-10-25: 2 g via INTRAVENOUS
  Filled 2018-10-25: qty 100

## 2018-10-25 MED ORDER — ONDANSETRON HCL 4 MG/2ML IJ SOLN
INTRAMUSCULAR | Status: DC | PRN
  Administered 2018-10-25: 4 mg via INTRAVENOUS

## 2018-10-25 MED ORDER — DOXAZOSIN MESYLATE 2 MG PO TABS
2.0000 mg | ORAL_TABLET | Freq: Every day | ORAL | Status: DC
  Administered 2018-10-26: 2 mg via ORAL
  Filled 2018-10-25: qty 1

## 2018-10-25 MED ORDER — DEXAMETHASONE SODIUM PHOSPHATE 10 MG/ML IJ SOLN
INTRAMUSCULAR | Status: DC | PRN
  Administered 2018-10-25: 8 mg via INTRAVENOUS

## 2018-10-25 MED ORDER — BUPIVACAINE LIPOSOME 1.3 % IJ SUSP
INTRAMUSCULAR | Status: DC | PRN
  Administered 2018-10-25: 10 mL via PERINEURAL

## 2018-10-25 MED ORDER — KETOROLAC TROMETHAMINE 15 MG/ML IJ SOLN
15.0000 mg | Freq: Four times a day (QID) | INTRAMUSCULAR | Status: DC
  Administered 2018-10-25 – 2018-10-26 (×3): 15 mg via INTRAVENOUS
  Filled 2018-10-25 (×3): qty 1

## 2018-10-25 MED ORDER — SUGAMMADEX SODIUM 200 MG/2ML IV SOLN
INTRAVENOUS | Status: DC | PRN
  Administered 2018-10-25: 200 mg via INTRAVENOUS

## 2018-10-25 MED ORDER — TRAZODONE HCL 50 MG PO TABS
50.0000 mg | ORAL_TABLET | Freq: Every day | ORAL | Status: DC
  Administered 2018-10-26: 50 mg via ORAL
  Filled 2018-10-25: qty 1

## 2018-10-25 MED ORDER — DOCUSATE SODIUM 100 MG PO CAPS
100.0000 mg | ORAL_CAPSULE | Freq: Two times a day (BID) | ORAL | Status: DC
  Administered 2018-10-25 – 2018-10-26 (×2): 100 mg via ORAL
  Filled 2018-10-25 (×2): qty 1

## 2018-10-25 MED ORDER — LACTATED RINGERS IV SOLN: INTRAVENOUS | Status: DC

## 2018-10-25 MED ORDER — SODIUM CHLORIDE 0.9 % IR SOLN
Status: DC | PRN
  Administered 2018-10-25: 1000 mL

## 2018-10-25 MED ORDER — DIPHENHYDRAMINE HCL 12.5 MG/5ML PO ELIX: 12.5000 mg | ORAL_SOLUTION | ORAL | Status: DC | PRN

## 2018-10-25 MED ORDER — LABETALOL HCL 5 MG/ML IV SOLN
5.0000 mg | Freq: Once | INTRAVENOUS | Status: AC
  Administered 2018-10-25: 15:00:00 5 mg via INTRAVENOUS

## 2018-10-25 MED ORDER — ACETAMINOPHEN 325 MG PO TABS: 325.0000 mg | ORAL_TABLET | Freq: Four times a day (QID) | ORAL | Status: DC | PRN

## 2018-10-25 MED ORDER — GABAPENTIN 400 MG PO CAPS
1200.0000 mg | ORAL_CAPSULE | Freq: Three times a day (TID) | ORAL | Status: DC
  Administered 2018-10-25 – 2018-10-26 (×2): 1200 mg via ORAL
  Filled 2018-10-25 (×2): qty 3

## 2018-10-25 MED ORDER — EPHEDRINE SULFATE-NACL 50-0.9 MG/10ML-% IV SOSY
PREFILLED_SYRINGE | INTRAVENOUS | Status: DC | PRN
  Administered 2018-10-25 (×4): 5 mg via INTRAVENOUS
  Administered 2018-10-25: 15 mg via INTRAVENOUS
  Administered 2018-10-25: 5 mg via INTRAVENOUS
  Administered 2018-10-25 (×4): 10 mg via INTRAVENOUS

## 2018-10-25 MED ORDER — METHOCARBAMOL 500 MG PO TABS
500.0000 mg | ORAL_TABLET | Freq: Four times a day (QID) | ORAL | Status: DC | PRN
  Administered 2018-10-26: 500 mg via ORAL
  Filled 2018-10-25: qty 1

## 2018-10-25 MED ORDER — POLYETHYLENE GLYCOL 3350 17 G PO PACK: 17.0000 g | PACK | Freq: Every day | ORAL | Status: DC | PRN

## 2018-10-25 MED ORDER — METHOCARBAMOL 500 MG IVPB - SIMPLE MED
INTRAVENOUS | Status: AC
  Filled 2018-10-25: qty 50

## 2018-10-25 MED ORDER — METOCLOPRAMIDE HCL 5 MG PO TABS: 5.0000 mg | ORAL_TABLET | Freq: Three times a day (TID) | ORAL | Status: DC | PRN

## 2018-10-25 MED ORDER — ONDANSETRON HCL 4 MG/2ML IJ SOLN
INTRAMUSCULAR | Status: AC
  Filled 2018-10-25: qty 2

## 2018-10-25 MED ORDER — FENTANYL CITRATE (PF) 100 MCG/2ML IJ SOLN
25.0000 ug | INTRAMUSCULAR | Status: DC | PRN
  Administered 2018-10-25: 50 ug via INTRAVENOUS

## 2018-10-25 MED ORDER — BISACODYL 5 MG PO TBEC: 5.0000 mg | DELAYED_RELEASE_TABLET | Freq: Every day | ORAL | Status: DC | PRN

## 2018-10-25 MED ORDER — MAGNESIUM CITRATE PO SOLN: 1.0000 | Freq: Once | ORAL | Status: DC | PRN

## 2018-10-25 MED ORDER — ONDANSETRON HCL 4 MG/2ML IJ SOLN: 4.0000 mg | Freq: Once | INTRAMUSCULAR | Status: DC | PRN

## 2018-10-25 MED ORDER — HYDRALAZINE HCL 20 MG/ML IJ SOLN
INTRAMUSCULAR | Status: AC
  Filled 2018-10-25: qty 1

## 2018-10-25 MED ORDER — HYDRALAZINE HCL 20 MG/ML IJ SOLN
10.0000 mg | Freq: Once | INTRAMUSCULAR | Status: AC
  Administered 2018-10-25: 10 mg via INTRAVENOUS

## 2018-10-25 MED ORDER — LABETALOL HCL 5 MG/ML IV SOLN
INTRAVENOUS | Status: AC
  Filled 2018-10-25: qty 4

## 2018-10-25 MED ORDER — FENTANYL CITRATE (PF) 100 MCG/2ML IJ SOLN
INTRAMUSCULAR | Status: AC
  Filled 2018-10-25: qty 2

## 2018-10-25 MED ORDER — PHENYLEPHRINE HCL (PRESSORS) 10 MG/ML IV SOLN
INTRAVENOUS | Status: AC
  Filled 2018-10-25: qty 1

## 2018-10-25 MED ORDER — ROCURONIUM BROMIDE 10 MG/ML (PF) SYRINGE
PREFILLED_SYRINGE | INTRAVENOUS | Status: AC
  Filled 2018-10-25: qty 10

## 2018-10-25 MED ORDER — CHLORTHALIDONE 25 MG PO TABS: 12.5000 mg | ORAL_TABLET | Freq: Every day | ORAL | Status: DC | PRN

## 2018-10-25 MED ORDER — OXYCODONE HCL 5 MG PO TABS
5.0000 mg | ORAL_TABLET | ORAL | Status: DC | PRN
  Administered 2018-10-25: 21:00:00 5 mg via ORAL
  Filled 2018-10-25: qty 1

## 2018-10-25 MED ORDER — TRANEXAMIC ACID-NACL 1000-0.7 MG/100ML-% IV SOLN
1000.0000 mg | INTRAVENOUS | Status: AC
  Administered 2018-10-25: 1000 mg via INTRAVENOUS
  Filled 2018-10-25: qty 100

## 2018-10-25 MED ORDER — BUPIVACAINE-EPINEPHRINE (PF) 0.5% -1:200000 IJ SOLN
INTRAMUSCULAR | Status: DC | PRN
  Administered 2018-10-25: 15 mL via PERINEURAL

## 2018-10-25 MED ORDER — LACTATED RINGERS IV SOLN
INTRAVENOUS | Status: DC
  Administered 2018-10-25: 11:00:00 via INTRAVENOUS

## 2018-10-25 MED ORDER — METOCLOPRAMIDE HCL 5 MG/ML IJ SOLN: 5.0000 mg | Freq: Three times a day (TID) | INTRAMUSCULAR | Status: DC | PRN

## 2018-10-25 MED ORDER — ONDANSETRON HCL 4 MG PO TABS: 4.0000 mg | ORAL_TABLET | Freq: Four times a day (QID) | ORAL | Status: DC | PRN

## 2018-10-25 MED ORDER — METHOCARBAMOL 500 MG IVPB - SIMPLE MED
500.0000 mg | Freq: Four times a day (QID) | INTRAVENOUS | Status: DC | PRN
  Administered 2018-10-25: 500 mg via INTRAVENOUS
  Filled 2018-10-25: qty 50

## 2018-10-25 MED ORDER — STERILE WATER FOR IRRIGATION IR SOLN
Status: DC | PRN
  Administered 2018-10-25: 2000 mL

## 2018-10-25 MED ORDER — FENTANYL CITRATE (PF) 100 MCG/2ML IJ SOLN
50.0000 ug | INTRAMUSCULAR | Status: DC
  Administered 2018-10-25: 12:00:00 100 ug via INTRAVENOUS
  Filled 2018-10-25: qty 2

## 2018-10-25 MED ORDER — DEXAMETHASONE SODIUM PHOSPHATE 10 MG/ML IJ SOLN
INTRAMUSCULAR | Status: AC
  Filled 2018-10-25: qty 1

## 2018-10-25 MED ORDER — LOSARTAN POTASSIUM 50 MG PO TABS
50.0000 mg | ORAL_TABLET | Freq: Every day | ORAL | Status: DC
  Administered 2018-10-26: 10:00:00 50 mg via ORAL
  Filled 2018-10-25: qty 1

## 2018-10-25 MED ORDER — ROCURONIUM BROMIDE 10 MG/ML (PF) SYRINGE
PREFILLED_SYRINGE | INTRAVENOUS | Status: DC | PRN
  Administered 2018-10-25 (×2): 10 mg via INTRAVENOUS
  Administered 2018-10-25: 60 mg via INTRAVENOUS
  Administered 2018-10-25: 10 mg via INTRAVENOUS

## 2018-10-25 MED ORDER — HYDROMORPHONE HCL 1 MG/ML IJ SOLN
0.5000 mg | INTRAMUSCULAR | Status: DC | PRN
  Administered 2018-10-26: 04:00:00 0.5 mg via INTRAVENOUS
  Filled 2018-10-25: qty 1

## 2018-10-25 MED ORDER — CHLORHEXIDINE GLUCONATE 4 % EX LIQD: 60.0000 mL | Freq: Once | CUTANEOUS | Status: DC

## 2018-10-25 MED ORDER — PANTOPRAZOLE SODIUM 40 MG PO TBEC
40.0000 mg | DELAYED_RELEASE_TABLET | Freq: Every day | ORAL | Status: DC
  Administered 2018-10-26: 10:00:00 40 mg via ORAL
  Filled 2018-10-25: qty 1

## 2018-10-25 MED ORDER — ONDANSETRON HCL 4 MG/2ML IJ SOLN: 4.0000 mg | Freq: Four times a day (QID) | INTRAMUSCULAR | Status: DC | PRN

## 2018-10-25 MED ORDER — LEVOTHYROXINE SODIUM 125 MCG PO TABS
125.0000 ug | ORAL_TABLET | Freq: Every day | ORAL | Status: DC
  Administered 2018-10-26: 125 ug via ORAL
  Filled 2018-10-25: qty 1

## 2018-10-25 MED ORDER — ALUM & MAG HYDROXIDE-SIMETH 200-200-20 MG/5ML PO SUSP: 30.0000 mL | ORAL | Status: DC | PRN

## 2018-10-25 MED ORDER — LIDOCAINE 2% (20 MG/ML) 5 ML SYRINGE
INTRAMUSCULAR | Status: AC
  Filled 2018-10-25: qty 5

## 2018-10-25 MED ORDER — MENTHOL 3 MG MT LOZG: 1.0000 | LOZENGE | OROMUCOSAL | Status: DC | PRN

## 2018-10-25 MED ORDER — MIDAZOLAM HCL 2 MG/2ML IJ SOLN
1.0000 mg | INTRAMUSCULAR | Status: DC
  Administered 2018-10-25: 12:00:00 2 mg via INTRAVENOUS
  Filled 2018-10-25: qty 2

## 2018-10-25 MED ORDER — PROPOFOL 10 MG/ML IV BOLUS
INTRAVENOUS | Status: DC | PRN
  Administered 2018-10-25: 140 mg via INTRAVENOUS

## 2018-10-25 MED ORDER — LIDOCAINE 2% (20 MG/ML) 5 ML SYRINGE
INTRAMUSCULAR | Status: DC | PRN
  Administered 2018-10-25: 60 mg via INTRAVENOUS

## 2018-10-25 SURGICAL SUPPLY — 62 items
BAG ZIPLOCK 12X15 (MISCELLANEOUS) ×3 IMPLANT
BLADE SAW SGTL 83.5X18.5 (BLADE) ×3 IMPLANT
COOLER ICEMAN CLASSIC (MISCELLANEOUS) IMPLANT
COVER BACK TABLE 60X90IN (DRAPES) ×3 IMPLANT
COVER SURGICAL LIGHT HANDLE (MISCELLANEOUS) ×3 IMPLANT
COVER WAND RF STERILE (DRAPES) ×2 IMPLANT
CUP SUT UNIV REVERS 39 NEU (Shoulder) ×2 IMPLANT
DERMABOND ADVANCED (GAUZE/BANDAGES/DRESSINGS) ×2
DERMABOND ADVANCED .7 DNX12 (GAUZE/BANDAGES/DRESSINGS) ×1 IMPLANT
DRAPE INCISE IOBAN 66X45 STRL (DRAPES) IMPLANT
DRAPE ORTHO SPLIT 77X108 STRL (DRAPES) ×4
DRAPE SHEET LG 3/4 BI-LAMINATE (DRAPES) ×3 IMPLANT
DRAPE SURG 17X11 SM STRL (DRAPES) ×3 IMPLANT
DRAPE SURG ORHT 6 SPLT 77X108 (DRAPES) ×2 IMPLANT
DRAPE U-SHAPE 47X51 STRL (DRAPES) ×3 IMPLANT
DRSG AQUACEL AG ADV 3.5X10 (GAUZE/BANDAGES/DRESSINGS) ×3 IMPLANT
DURAPREP 26ML APPLICATOR (WOUND CARE) ×3 IMPLANT
ELECT BLADE TIP CTD 4 INCH (ELECTRODE) ×3 IMPLANT
ELECT REM PT RETURN 15FT ADLT (MISCELLANEOUS) ×3 IMPLANT
FACESHIELD WRAPAROUND (MASK) ×12 IMPLANT
FACESHIELD WRAPAROUND OR TEAM (MASK) ×4 IMPLANT
GLENOID UNI REV MOD 24 +2 LAT (Joint) ×2 IMPLANT
GLENOSPHERE 39+4 LAT/24 UNI RV (Joint) ×2 IMPLANT
GLOVE BIO SURGEON STRL SZ7.5 (GLOVE) ×3 IMPLANT
GLOVE BIO SURGEON STRL SZ8 (GLOVE) ×3 IMPLANT
GLOVE SS BIOGEL STRL SZ 7 (GLOVE) ×1 IMPLANT
GLOVE SS BIOGEL STRL SZ 7.5 (GLOVE) ×1 IMPLANT
GLOVE SUPERSENSE BIOGEL SZ 7 (GLOVE) ×2
GLOVE SUPERSENSE BIOGEL SZ 7.5 (GLOVE) ×2
GOWN STRL REUS W/TWL LRG LVL3 (GOWN DISPOSABLE) ×6 IMPLANT
INSERT HUMERAL MED 39/ +3 (Shoulder) IMPLANT
INSERT MEDIUM HUMERAL 39/ +3 (Shoulder) ×2 IMPLANT
KIT BASIN OR (CUSTOM PROCEDURE TRAY) ×3 IMPLANT
KIT TURNOVER KIT A (KITS) ×2 IMPLANT
MANIFOLD NEPTUNE II (INSTRUMENTS) ×3 IMPLANT
NDL TAPERED W/ NITINOL LOOP (MISCELLANEOUS) ×1 IMPLANT
NEEDLE TAPERED W/ NITINOL LOOP (MISCELLANEOUS) ×3 IMPLANT
NS IRRIG 1000ML POUR BTL (IV SOLUTION) ×3 IMPLANT
PACK SHOULDER (CUSTOM PROCEDURE TRAY) ×3 IMPLANT
PAD ARMBOARD 7.5X6 YLW CONV (MISCELLANEOUS) ×3 IMPLANT
PAD COLD SHLDR WRAP-ON (PAD) ×2 IMPLANT
RESTRAINT HEAD UNIVERSAL NS (MISCELLANEOUS) ×3 IMPLANT
SCREW CENTRAL MOD 30MM (Screw) ×2 IMPLANT
SCREW PERIPHERAL 5.5X20 LOCK (Screw) ×4 IMPLANT
SCREW PERIPHERAL 5.5X28 LOCK (Screw) ×4 IMPLANT
SLING ARM FOAM STRAP LRG (SOFTGOODS) ×2 IMPLANT
SLING ARM FOAM STRAP MED (SOFTGOODS) ×2 IMPLANT
SPONGE LAP 18X18 RF (DISPOSABLE) IMPLANT
STEM HUMERAL UNI REVERS SZ6 (Stem) ×2 IMPLANT
SUCTION FRAZIER HANDLE 12FR (TUBING) ×2
SUCTION TUBE FRAZIER 12FR DISP (TUBING) ×1 IMPLANT
SUT FIBERWIRE #2 38 T-5 BLUE (SUTURE)
SUT MNCRL AB 3-0 PS2 18 (SUTURE) ×3 IMPLANT
SUT MON AB 2-0 CT1 36 (SUTURE) ×3 IMPLANT
SUT VIC AB 1 CT1 36 (SUTURE) ×3 IMPLANT
SUTURE FIBERWR #2 38 T-5 BLUE (SUTURE) IMPLANT
SUTURE TAPE 1.3 40 TPR END (SUTURE) ×2 IMPLANT
SUTURETAPE 1.3 40 TPR END (SUTURE) ×6
TOWEL OR 17X26 10 PK STRL BLUE (TOWEL DISPOSABLE) ×3 IMPLANT
TOWEL OR NON WOVEN STRL DISP B (DISPOSABLE) ×3 IMPLANT
WATER STERILE IRR 1000ML POUR (IV SOLUTION) ×6 IMPLANT
YANKAUER SUCT BULB TIP 10FT TU (MISCELLANEOUS) ×3 IMPLANT

## 2018-10-25 NOTE — H&P (Signed)
Patria Mane    Chief Complaint: right shoulder rotator cuff tear arthropathy HPI: The patient is a 68 y.o. male with and stage right shoulder rotator cuff tear arthropathy and progressive increases in pain and functional limitations.  Past Medical History:  Diagnosis Date  . Arthritis   . Chronic back pain   . Hypertension   . Sleep apnea    uses CPAP    Past Surgical History:  Procedure Laterality Date  . FOOT SURGERY    . HAND SURGERY     cyst removed-right hand and elbow   . left hand surgery     cyst removed from finger  . SHOULDER SURGERY    . TOTAL THYROIDECTOMY      Family History  Problem Relation Age of Onset  . Hyperlipidemia Mother   . Hyperlipidemia Father   . Hyperlipidemia Sister   . Cancer Brother   . Stroke Brother   . Hyperlipidemia Brother     Social History:  reports that he has quit smoking. He has never used smokeless tobacco. He reports current alcohol use of about 1.0 standard drinks of alcohol per week. He reports that he does not use drugs.   Medications Prior to Admission  Medication Sig Dispense Refill  . acetaminophen (TYLENOL) 500 MG tablet Take 500 mg by mouth 3 (three) times daily.    . chlorthalidone (HYGROTON) 25 MG tablet Take 12.5 mg by mouth daily as needed (if systolic blood pressure over 125).     Marland Kitchen diclofenac sodium (VOLTAREN) 1 % GEL Apply 1 application topically 3 (three) times daily as needed (pain).    Marland Kitchen doxazosin (CARDURA) 4 MG tablet Take 2 mg by mouth at bedtime.    . gabapentin (NEURONTIN) 600 MG tablet Take 1,200 mg by mouth 3 (three) times daily.    Marland Kitchen HYDROcodone-acetaminophen (NORCO/VICODIN) 5-325 MG per tablet 1 tab PO q12 hours prn pain (Patient taking differently: Take 1 tablet by mouth 3 (three) times daily as needed for moderate pain. ) 10 tablet 0  . levothyroxine (SYNTHROID) 125 MCG tablet Take 125 mcg by mouth daily before breakfast.    . losartan (COZAAR) 50 MG tablet Take 50 mg by mouth daily.    Marland Kitchen tiZANidine  (ZANAFLEX) 4 MG tablet Take 4 mg by mouth at bedtime.     . traZODone (DESYREL) 50 MG tablet Take 50 mg by mouth at bedtime.    . vitamin B-12 (CYANOCOBALAMIN) 500 MCG tablet Take 500 mcg by mouth daily.       Physical Exam: Right shoulder demonstrates a profoundly restricted range of motion with severe pain and global weakness.  Plain radiographs confirm complete obliteration of the joint space with osteophyte formation and subchondral sclerosis.  Vitals  Temp:  [98 F (36.7 C)] 98 F (36.7 C) (09/17 1013) Pulse Rate:  [88] 88 (09/17 1013) Resp:  [14] 14 (09/17 1013) BP: (134)/(78) 134/78 (09/17 1013) SpO2:  [99 %] 99 % (09/17 1013) Weight:  [76.7 kg] 76.7 kg (09/17 1024)  Assessment/Plan  Impression: right shoulder rotator cuff tear arthropathy  Plan of Action: Procedure(s): REVERSE SHOULDER ARTHROPLASTY   M  10/25/2018, 11:48 AM Contact # [phone]

## 2018-10-25 NOTE — Progress Notes (Signed)
Assisted Dr. Rob Fitzgerald with right, ultrasound guided, interscalene  block. Side rails up, monitors on throughout procedure. See vital signs in flow sheet. Tolerated Procedure well. 

## 2018-10-25 NOTE — Anesthesia Preprocedure Evaluation (Signed)
Anesthesia Evaluation  Patient identified by MRN, date of birth, ID band Patient awake    Reviewed: Allergy & Precautions, NPO status , Patient's Chart, lab work & pertinent test results  Airway Mallampati: II  TM Distance: >3 FB Neck ROM: Full    Dental  (+) Dental Advisory Given   Pulmonary sleep apnea and Continuous Positive Airway Pressure Ventilation , former smoker,    breath sounds clear to auscultation       Cardiovascular hypertension, Pt. on medications  Rhythm:Regular Rate:Normal     Neuro/Psych negative neurological ROS     GI/Hepatic negative GI ROS, Neg liver ROS,   Endo/Other  negative endocrine ROS  Renal/GU negative Renal ROS     Musculoskeletal  (+) Arthritis ,   Abdominal   Peds  Hematology negative hematology ROS (+)   Anesthesia Other Findings   Reproductive/Obstetrics                             Lab Results  Component Value Date   WBC 2.6 (L) 10/18/2018   HGB 13.2 10/18/2018   HCT 39.0 10/18/2018   MCV 94.7 10/18/2018   PLT 123 (L) 10/18/2018   Lab Results  Component Value Date   CREATININE 0.91 10/18/2018   BUN 13 10/18/2018   NA 139 10/18/2018   K 4.7 10/18/2018   CL 103 10/18/2018   CO2 27 10/18/2018    Anesthesia Physical Anesthesia Plan  ASA: II  Anesthesia Plan: General   Post-op Pain Management:  Regional for Post-op pain   Induction: Intravenous  PONV Risk Score and Plan: 2 and Dexamethasone, Ondansetron and Treatment may vary due to age or medical condition  Airway Management Planned: Oral ETT  Additional Equipment:   Intra-op Plan:   Post-operative Plan: Extubation in OR  Informed Consent: I have reviewed the patients History and Physical, chart, labs and discussed the procedure including the risks, benefits and alternatives for the proposed anesthesia with the patient or authorized representative who has indicated his/her  understanding and acceptance.     Dental advisory given  Plan Discussed with: CRNA  Anesthesia Plan Comments:         Anesthesia Quick Evaluation

## 2018-10-25 NOTE — Anesthesia Procedure Notes (Signed)
Procedure Name: Intubation Date/Time: 10/25/2018 12:48 PM Performed by: Eben Burow, CRNA Pre-anesthesia Checklist: Patient identified, Emergency Drugs available, Suction available, Patient being monitored and Timeout performed Patient Re-evaluated:Patient Re-evaluated prior to induction Oxygen Delivery Method: Circle system utilized Preoxygenation: Pre-oxygenation with 100% oxygen Induction Type: IV induction Ventilation: Mask ventilation without difficulty Laryngoscope Size: Mac and 4 Grade View: Grade II Tube type: Oral Tube size: 7.5 mm Number of attempts: 1 Airway Equipment and Method: Stylet Placement Confirmation: ETT inserted through vocal cords under direct vision,  positive ETCO2 and breath sounds checked- equal and bilateral Secured at: 23 cm Tube secured with: Tape Dental Injury: Teeth and Oropharynx as per pre-operative assessment

## 2018-10-25 NOTE — Plan of Care (Signed)
  Problem: Education: Goal: Knowledge of the prescribed therapeutic regimen will improve Outcome: Progressing Goal: Understanding of activity limitations/precautions following surgery will improve Outcome: Progressing Goal: Individualized Educational Video(s) Outcome: Progressing   Problem: Activity: Goal: Ability to tolerate increased activity will improve Outcome: Progressing   

## 2018-10-25 NOTE — Op Note (Signed)
10/25/2018  2:11 PM  PATIENT:   Douglas Hardy  68 y.o. male  PRE-OPERATIVE DIAGNOSIS:  right shoulder rotator cuff tear arthropathy  POST-OPERATIVE DIAGNOSIS: Same  PROCEDURE: Right shoulder reverse arthroplasty utilizing a press-fit size 6 Arthrex stem, +3 polyethylene insert, 39/+4 glenosphere on a small/+2 baseplate  SURGEON:  , Metta Clines Hardy.D.  ASSISTANTS: Jenetta Loges, PA-C  ANESTHESIA:   General endotracheal and interscalene block with Exparel  EBL: 250 cc  SPECIMEN: None  Drains: None   PATIENT DISPOSITION:  PACU - hemodynamically stable.    PLAN OF CARE: Admit for overnight observation  Brief history:  Douglas Hardy is a 68 year old gentleman with a long history of right shoulder difficulties.  He had original recurrent instability in the 1970s for which he had undergone an open reconstruction with a recurrence and then a revision anterior reconstruction procedure.  Did well for a number of years but more recently has developed increasing pain and restricted mobility.  His examination in the office demonstrates profoundly restricted shoulder motion with significant pain and global weakness.  Preoperative studies confirmed chronic rotator cuff tear as well as end stage arthritis.  Due to his increasing pain and functional rotations he is brought to the operating this time for planned right shoulder reverse arthroplasty.  Preoperatively I counseled Douglas Hardy regarding treatment options as well as the potential risks versus benefits thereof.  Possible surgical complications were reviewed including bleeding, infection, neurovascular injury, persistent pain, loss of motion, failure of the implant, anesthetic complication, and possible need for additional surgery.  He understands, and accepts, and agrees with our planned procedure.  Procedure in detail:  After undergoing routine preop evaluation patient received prophylactic antibiotics and interscalene block with Exparel was  established in the holding area by the anesthesia department.  Placed supine on the operating table and underwent the smooth induction of a general endotracheal anesthesia.  Patient placed into the beachchair position and appropriately padded protected.  The right shoulder girdle region was sterilely prepped and draped in standard fashion.  Timeout was called.  He had a previous broad anterior axillary incision which was curvilinear and this was very medial and would I felt compromise our approach and so we made an alternative incision along the standard deltopectoral interval which did not cross his previous incision but certainly was quite close to it at his narrowest point.  Dissection carried deeply after incision was made first approximate 10 cm in length.  We did have vigorous bleeding from all cut surfaces suggesting that there had been revascularization of all the subcutaneous tissue planes related to the previous incision.  Skin flaps were elevated dissection carried deeply the deltopectoral interval was then identified and developed from proximal to distal.  We did not identify a dominant cephalic vein however.  Significant scarring was then encountered medially such that the conjoined tendon and pectoralis major were difficult to separate and in addition adhesions between the remnant of the subscapularis and the conjoined tendon.  These tissue planes were carefully dissected and the upper 2 cm of the pectoralis major tendon was elevated to improve exposure and we then divided the capsular attachments anteriorly and medially to improve exposure.  The remnant of the subscapularis was then divided from its lesser tuberosity insertion and tagged with suture tape sutures.  At this point we then divided further the capsular attachments on the humeral neck region allowing deliver the humeral head through the wound.  We outlined our proposed humeral head resection with the extra  medullary guide and this was then  completed with an oscillating saw.  A metal cap was placed over the cut proximal humeral surface and at this point we turned our attention to the glenoid which we gained circumferential exposure removing remnants of the labrum and a guidepin was then directed into the center of the glenoid with an approximately 10 degree inferior tilt and the glenoid was prepared with the central and peripheral reamers.  Our central drill hole was then reamed and tapped and a 30 mm lag screw was utilized on the baseplate.  The baseplate was then inserted with excellent purchase and fixation.  The peripheral locking screws were all then placed using standard technique again achieving excellent purchase and fixation.  A 39/+4 glenosphere was then impacted on the baseplate and the central locking screw was placed with good fixation.  We then returned our attention to the proximal humerus where the canal was prepared hand reaming to size 7 but then ultimately broaching to size 6 which shows surprisingly gave extremely tight fit and so we prepared the metaphysis with our central cheese grater reamer and then a trial implant was placed trial reduction showed good soft tissue balance and good stability.  This point our final implant was assembled on the back table and was then impacted into the humeral canal with excellent fit and fixation.  Trial reduction was performed again and we felt that the +3 polyethylene insert gave us the best soft tissue balance with excellent stability and motion.  Trial was removed and the final +3 poly-was then impacted the joint was copes irrigated and final reduction was then performed.  Very pleased with the overall soft tissue balance shoulder motion and stability.  The subscapularis was then repaired back to the metaphyseal region using the eyelets on the collar of our implant.  The deltopectoral interval was then reapproximated with a series of figure-of-eight and 1 Vicryl sutures.  2-0 Vicryl used for  the subcu layer and intracuticular 3 Monocryl the skin followed by Dermabond and Aquasol dressing right arm was then placed in a sling the patient awakened, extubated, and taken recovery in stable addition.  Douglas Batheracy Shuford, PA-C was used as an Geophysicist/field seismologistassistant throughout this case essential for help with positioning the patient, positioning extremity, tissue manipulation, implantation of the prosthesis, wound closure, and intraoperative decision making.  Douglas ReaKevin M  MD   Contact # [phone]

## 2018-10-25 NOTE — Transfer of Care (Signed)
Immediate Anesthesia Transfer of Care Note  Patient: Shaquan Missey  Procedure(s) Performed: REVERSE SHOULDER ARTHROPLASTY (Right Shoulder)  Patient Location: PACU  Anesthesia Type:General  Level of Consciousness: awake, alert  and oriented  Airway & Oxygen Therapy: Patient Spontanous Breathing and Patient connected to face mask oxygen  Post-op Assessment: Report given to RN and Post -op Vital signs reviewed and stable  Post vital signs: Reviewed and stable  Last Vitals:  Vitals Value Taken Time  BP 179/91 10/25/18 1445  Temp    Pulse 83 10/25/18 1447  Resp 14 10/25/18 1447  SpO2 100 % 10/25/18 1447  Vitals shown include unvalidated device data.  Last Pain:  Vitals:   10/25/18 1024  TempSrc:   PainSc: 3       Patients Stated Pain Goal: 3 (93/23/55 7322)  Complications: No apparent anesthesia complications

## 2018-10-25 NOTE — Anesthesia Procedure Notes (Addendum)
Anesthesia Regional Block: Interscalene brachial plexus block   Pre-Anesthetic Checklist: ,, timeout performed, Correct Patient, Correct Site, Correct Laterality, Correct Procedure, Correct Position, site marked, Risks and benefits discussed,  Surgical consent,  Pre-op evaluation,  At surgeon's request and post-op pain management  Laterality: Right  Prep: chloraprep       Needles:  Injection technique: Single-shot  Needle Type: Echogenic Stimulator Needle     Needle Length: 9cm  Needle Gauge: 21     Additional Needles:   Procedures:, nerve stimulator,,, ultrasound used (permanent image in chart),,,,   Nerve Stimulator or Paresthesia:  Response: deltoid, 0.5 mA,   Additional Responses:   Narrative:  Start time: 10/25/2018 11:48 AM End time: 10/25/2018 11:55 AM Injection made incrementally with aspirations every 5 mL.  Performed by: Personally  Anesthesiologist: Suzette Battiest, MD

## 2018-10-25 NOTE — Discharge Instructions (Signed)
° °Kevin M. Supple, M.D., F.A.A.O.S. °Orthopaedic Surgery °Specializing in Arthroscopic and Reconstructive °Surgery of the Shoulder °[phone] °[address] - [place], [place] [address] - Fax [phone] ° ° °POST-OP TOTAL SHOULDER REPLACEMENT INSTRUCTIONS ° °1. Call the office at [phone] to schedule your first post-op appointment 10-14 days from the date of your surgery. ° °2. The bandage over your incision is waterproof. You may begin showering with this dressing on. You may leave this dressing on until first follow up appointment within 2 weeks. We prefer you leave this dressing in place until follow up however after 5-7 days if you are having itching or skin irritation and would like to remove it you may do so. Go slow and tug at the borders gently to break the bond the dressing has with the skin. At this point if there is no drainage it is okay to go without a bandage or you may cover it with a light guaze and tape. You can also expect significant bruising around your shoulder that will drift down your arm and into your chest wall. This is very normal and should resolve over several days. ° ° 3. Wear your sling/immobilizer at all times except to perform the exercises below or to occasionally let your arm dangle by your side to stretch your elbow. You also need to sleep in your sling immobilizer until instructed otherwise. It is ok to remove your sling if you are sitting in a controlled environment and allow your arm to rest in a position of comfort by your side or on your lap with pillows to give your neck and skin a break from the sling. You may remove it to allow arm to dangle by side to shower. If you are up walking around and when you go to sleep at night you need to wear it. ° °4. Range of motion to your elbow, wrist, and hand are encouraged 3-5 times daily. Exercise to your hand and fingers helps to reduce swelling you may experience. ° °5. Utilize ice to the shoulder 3-5 times  minimum a day and additionally if you are experiencing pain. ° °6. Prescriptions for a pain medication and a muscle relaxant are provided for you. It is recommended that if you are experiencing pain that you pain medication alone is not controlling, add the muscle relaxant along with the pain medication which can give additional pain relief. The first 1-2 days is generally the most severe of your pain and then should gradually decrease. As your pain lessens it is recommended that you decrease your use of the pain medications to an "as needed basis'" only and to always comply with the recommended dosages of the pain medications. ° °7. Pain medications can produce constipation along with their use. If you experience this, the use of an over the counter stool softener or laxative daily is recommended.  ° °8. For additional questions or concerns, please do not hesitate to call the office. If after hours there is an answering service to forward your concerns to the physician on call. ° °9.Pain control following an exparel block ° °To help control your post-operative pain you received a nerve block  performed with Exparel which is a long acting anesthetic (numbing agent) which can provide pain relief and sensations of numbness (and relief of pain) in the operative shoulder and arm for up to 3 days. Sometimes it provides mixed relief, meaning you may still have numbness in certain areas of the arm but can still   be able to move  parts of that arm, hand, and fingers. We recommend that your prescribed pain medications  be used as needed. We do not feel it is necessary to "pre medicate" and "stay ahead" of pain.  Taking narcotic pain medications when you are not having any pain can lead to unnecessary and potentially dangerous side effects.   ° °10. Use the ice machine as much as possible in the first 5-7 days from surgery, then you can wean its use to as needed. The ice typically needs to be replaced every 6 hours, instead of  ice you can actually freeze water bottles to put in the cooler and then fill water around them to avoid having to purchase ice. You can have spare water bottles freezing to allow you to rotate them once they have melted. Try to have a thin shirt or light cloth or towel under the ice wrap to protect your skin. ° °POST-OP EXERCISES ° °Pendulum Exercises ° °Perform pendulum exercises while standing and bending at the waist. Support your uninvolved arm on a table or chair and allow your operated arm to hang freely. Make sure to do these exercises passively - not using you shoulder muscles. ° °Repeat 20 times. Do 3 sessions per day. ° ° ° ° °

## 2018-10-25 NOTE — Anesthesia Postprocedure Evaluation (Signed)
Anesthesia Post Note  Patient: Douglas Hardy  Procedure(s) Performed: REVERSE SHOULDER ARTHROPLASTY (Right Shoulder)     Patient location during evaluation: PACU Anesthesia Type: General Level of consciousness: awake and alert Pain management: pain level controlled Vital Signs Assessment: post-procedure vital signs reviewed and stable Respiratory status: spontaneous breathing, nonlabored ventilation, respiratory function stable and patient connected to nasal cannula oxygen Cardiovascular status: blood pressure returned to baseline and stable Postop Assessment: no apparent nausea or vomiting Anesthetic complications: no    Last Vitals:  Vitals:   10/25/18 1615 10/25/18 1630  BP: 137/84 (!) 142/85  Pulse: 64 65  Resp: 11 12  Temp:    SpO2: 100% 100%    Last Pain:  Vitals:   10/25/18 1630  TempSrc:   PainSc: 3                  Tiajuana Amass

## 2018-10-26 ENCOUNTER — Encounter (HOSPITAL_COMMUNITY): Payer: Self-pay | Admitting: Orthopedic Surgery

## 2018-10-26 MED ORDER — SODIUM CHLORIDE 0.9 % IV BOLUS: 250.0000 mL | Freq: Once | INTRAVENOUS | Status: DC

## 2018-10-26 MED ORDER — SODIUM CHLORIDE 0.9 % IV BOLUS
250.0000 mL | Freq: Once | INTRAVENOUS | Status: AC
  Administered 2018-10-26: 250 mL via INTRAVENOUS

## 2018-10-26 MED ORDER — ONDANSETRON HCL 4 MG PO TABS: 4.0000 mg | ORAL_TABLET | Freq: Three times a day (TID) | ORAL | 0 refills | Status: DC | PRN

## 2018-10-26 MED ORDER — OXYCODONE-ACETAMINOPHEN 5-325 MG PO TABS: 1.0000 | ORAL_TABLET | ORAL | 0 refills | Status: DC | PRN

## 2018-10-26 NOTE — Discharge Summary (Signed)
PATIENT ID:      Douglas Hardy  MRN:     [id number] DOB/AGE:    [birth year] / 68 y.o.     DISCHARGE SUMMARY  ADMISSION DATE:    10/25/2018 DISCHARGE DATE:    ADMISSION DIAGNOSIS: right shoulder rotator cuff tear arthropathy Past Medical History:  Diagnosis Date  . Arthritis   . Chronic back pain   . Hypertension   . Sleep apnea    uses CPAP    DISCHARGE DIAGNOSIS:   Active Problems:   S/P reverse total shoulder arthroplasty, right   PROCEDURE: Procedure(s): REVERSE SHOULDER ARTHROPLASTY on 10/25/2018  CONSULTS:    HISTORY:  See H&P in chart.  HOSPITAL COURSE:  Douglas Hardy is a 68 y.o. admitted on 10/25/2018 with a diagnosis of right shoulder rotator cuff tear arthropathy.  They were brought to the operating room on 10/25/2018 and underwent Procedure(s): [place].    They were given perioperative antibiotics:  Anti-infectives (From admission, onward)   Start     Dose/Rate Route Frequency Ordered Stop   10/25/18 1015  ceFAZolin (ANCEF) IVPB 2g/100 mL premix     2 g 200 mL/hr over 30 Minutes Intravenous On call to O.R. 10/25/18 1011 10/25/18 1249    .  Patient underwent the above named procedure and tolerated it well. The following day they were hemodynamically stable and pain was controlled on oral analgesics. They were neurovascularly intact to the operative extremity. OT was ordered and worked with patient per protocol. They were medically and orthopaedically stable for discharge on .    DIAGNOSTIC STUDIES:  RECENT RADIOGRAPHIC STUDIES :  No results found.  RECENT VITAL SIGNS:   Patient Vitals for the past 24 hrs:  BP Temp Temp src Pulse Resp SpO2 Height Weight  10/26/18 0647 101/67 (!) 97.5 F (36.4 C) Oral 63 14 100 % - -  10/26/18 0527 (!) 80/54 98 F (36.7 C) Oral 69 18 95 % - -  10/26/18 0043 (!) 143/79 - - 86 - - - -  10/25/18 2121 (!) 93/54 (!) 97.5 F (36.4 C) Oral 96 18 100 % - -  10/25/18 2023 (!) 144/79 98.1 F (36.7 C) Oral 91 18 100 %  - -  10/25/18 1924 98/60 97.7 F (36.5 C) Oral 92 18 100 % - -  10/25/18 1814 125/70 97.9 F (36.6 C) Oral 90 16 100 % - -  10/25/18 1752 138/74 - - 79 13 100 % - -  10/25/18 1715 (!) 147/82 - - 68 10 100 % - -  10/25/18 1700 (!) 153/79 (!) 97.5 F (36.4 C) - 67 12 100 % - -  10/25/18 1645 (!) 151/92 - - 71 12 100 % - -  10/25/18 1630 (!) 142/85 - - 65 12 100 % - -  10/25/18 1615 137/84 - - 64 11 100 % - -  10/25/18 1600 (!) 180/107 - - 66 13 100 % - -  10/25/18 1545 (!) 181/110 97.7 F (36.5 C) - 64 13 100 % - -  10/25/18 1530 (!) 185/101 - - 62 13 100 % - -  10/25/18 1515 (!) 189/103 - - 77 13 100 % - -  10/25/18 1500 (!) 173/89 - - 77 12 100 % - -  10/25/18 1445 (!) 179/91 97.6 F (36.4 C) - 81 14 100 % - -  10/25/18 1152 - - - 64 (!) 8 100 % - -  10/25/18 1151 - - - 61 14 100 % - -  10/25/18 1150 - - - 69 14 100 % - -  10/25/18 1149 - - - 65 (!) 8 100 % - -  10/25/18 1148 - - - (!) 59 11 98 % - -  10/25/18 1147 - - - 62 (!) 6 100 % - -  10/25/18 1146 - - - 60 (!) 7 98 % - -  10/25/18 1145 103/69 - - (!) 59 - 100 % - -  10/25/18 1143 101/69 - - 61 11 100 % - -  10/25/18 1024 - - - - - - 5\' 7"  (1.702 m) 76.7 kg  10/25/18 1013 134/78 98 F (36.7 C) Oral 88 14 99 % - -  .  RECENT EKG RESULTS:    Orders placed or performed during the hospital encounter of 10/18/18  . EKG 12 lead  . EKG 12 lead    DISCHARGE INSTRUCTIONS:    DISCHARGE MEDICATIONS:   Allergies as of 10/26/2018      Reactions   Lisinopril Swelling   LIPS   Celebrex [celecoxib] Rash      Medication List    STOP taking these medications   HYDROcodone-acetaminophen 5-325 MG tablet Commonly known as: NORCO/VICODIN     TAKE these medications   acetaminophen 500 MG tablet Commonly known as: TYLENOL Take 500 mg by mouth 3 (three) times daily.   chlorthalidone 25 MG tablet Commonly known as: HYGROTON Take 12.5 mg by mouth daily as needed (if systolic blood pressure over 125).   diclofenac sodium  1 % Gel Commonly known as: VOLTAREN Apply 1 application topically 3 (three) times daily as needed (pain).   doxazosin 4 MG tablet Commonly known as: CARDURA Take 2 mg by mouth at bedtime.   gabapentin 600 MG tablet Commonly known as: NEURONTIN Take 1,200 mg by mouth 3 (three) times daily.   levothyroxine 125 MCG tablet Commonly known as: SYNTHROID Take 125 mcg by mouth daily before breakfast.   losartan 50 MG tablet Commonly known as: COZAAR Take 50 mg by mouth daily.   ondansetron 4 MG tablet Commonly known as: Zofran Take 1 tablet (4 mg total) by mouth every 8 (eight) hours as needed for nausea or vomiting.   oxyCODONE-acetaminophen 5-325 MG tablet Commonly known as: Percocet Take 1 tablet by mouth every 4 (four) hours as needed (max 6 q).   tiZANidine 4 MG tablet Commonly known as: ZANAFLEX Take 4 mg by mouth at bedtime.   traZODone 50 MG tablet Commonly known as: DESYREL Take 50 mg by mouth at bedtime.   vitamin B-12 500 MCG tablet Commonly known as: CYANOCOBALAMIN Take 500 mcg by mouth daily.       FOLLOW UP VISIT:   Follow-up Information    Francena HanlySupple, Kevin, MD.   Specialty: Orthopedic Surgery Why: call to be seen in 10-14 days Contact information: [address] [place] [address] [phone]           DISCHARGE TO: Home   DISCHARGE CONDITION:  [facility]ust[facility] MaizeGood       for Dr. Francena HanlyKevin Supple 10/26/2018, 8:47 AM

## 2018-10-26 NOTE — Evaluation (Signed)
Occupational Therapy Evaluation Patient Details Name: Douglas Hardy Flinchbaugh MRN: [id number] DOB: 09/01/1950 Today's Date: 10/26/2018    History of Present Illness 68 year old man s/p R RTSA.  PMH:  shoulder sx and chronic back pain   Clinical Impression   Pt was admitted for the above sx. All education was completed. Pt will follow up with Dr Rennis ChrisSupple for further rehab.     Follow Up Recommendations  Follow surgeon's recommendation for DC plan and follow-up therapies    Equipment Recommendations  None recommended by OT    Recommendations for Other Services       Precautions / Restrictions Precautions Precautions: Shoulder Type of Shoulder Precautions: may come out of sling in controlled environment, may use RUE for adls within the following parameters:  P/AA/AROM 20 ER, 45 ABD, 60 FF.  May perform gentle pendulums and lap slides. May move elbow wrist and fingers.  NWB Required Braces or Orthoses: Sling Restrictions Other Position/Activity Restrictions: NWB RUE      Mobility Bed Mobility               General bed mobility comments: min A for OOB; pt plans to sleep in recliner  Transfers Overall transfer level: Needs assistance               General transfer comment: supervision to min guard for safety    Balance                                           ADL either performed or assessed with clinical judgement   ADL Overall ADL's : Needs assistance/impaired Eating/Feeding: Set up   Grooming: Set up   Upper Body Bathing: Minimal assistance   Lower Body Bathing: Minimal assistance   Upper Body Dressing : Moderate assistance   Lower Body Dressing: Minimal assistance(slip on shoes)   Toilet Transfer: Min guard;Ambulation   Toileting- Clothing Manipulation and Hygiene: Min guard         General ADL Comments: performed adl and walked around unit with min guard. Completed education for shoulder protocol and precautions and pt verbalizes all.   Performed gentle pendulums, lap slides and wrist finger ROM. Educated on elbow.  Block still in effect     Vision         Perception     Praxis      Pertinent Vitals/Pain Pain Assessment: Faces Faces Pain Scale: Hurts even more Pain Location: back, everywhere Pain Descriptors / Indicators: Aching Pain Intervention(s): Limited activity within patient's tolerance;Monitored during session;Repositioned;Patient requesting pain meds-RN notified     Hand Dominance Right   Extremity/Trunk Assessment Upper Extremity Assessment Upper Extremity Assessment: RUE deficits/detail(able to move fingers and wrist)           Communication Communication Communication: No difficulties   Cognition Arousal/Alertness: Awake/alert Behavior During Therapy: WFL for tasks assessed/performed Overall Cognitive Status: Within Functional Limits for tasks assessed                                     General Comments       Exercises     Shoulder Instructions      Home Living Family/patient expects to be discharged to:: Private residence Living Arrangements: Spouse/significant other  Bathroom Shower/Tub: Occupational psychologist: Handicapped height                Prior Functioning/Environment Level of Independence: Independent        Comments: pt walks 12500 steps per day        OT Problem List:        OT Treatment/Interventions:      OT Goals(Current goals can be found in the care plan section) Acute Rehab OT Goals Patient Stated Goal: return to independence OT Goal Formulation: All assessment and education complete, DC therapy  OT Frequency:     Barriers to D/C:            Co-evaluation              AM-PAC OT "6 Clicks" Daily Activity     Outcome Measure Help from another person eating meals?: A Little Help from another person taking care of personal grooming?: A Little Help from another person toileting, which  includes using toliet, bedpan, or urinal?: A Little Help from another person bathing (including washing, rinsing, drying)?: A Little Help from another person to put on and taking off regular upper body clothing?: A Little Help from another person to put on and taking off regular lower body clothing?: A Lot 6 Click Score: 17   End of Session Nurse Communication: (finished with OT)  Activity Tolerance: Patient tolerated treatment well Patient left: in chair;with call bell/phone within reach  OT Visit Diagnosis: Muscle weakness (generalized) (M62.81)                Time: 4268-3419 OT Time Calculation (min): 37 min Charges:  OT General Charges $OT Visit: 1 Visit OT Evaluation $OT Eval Low Complexity: 1 Low OT Treatments $Self Care/Home Management : 8-22 mins  Lesle Chris, OTR/L Acute Rehabilitation Services [phone] [facility] pager [phone] office 10/26/2018  [place] 10/26/2018, 10:06 AM

## 2018-10-26 NOTE — TOC Transition Note (Signed)
Transition of Care [facility]) - CM/SW Discharge Note   Patient Details  Name: Douglas Hardy MRN: [id number] Date of Birth: Jan 22, 1951  [facility]) CM/SW Contact:  Leeroy Cha, RN Phone Number: 10/26/2018, 9:47 AM   Clinical Narrative:     dcd to home with self care  Final next level of care: Home/Self Care Barriers to Discharge: No Barriers Identified   Patient Goals and CMS Choice        Discharge Placement                       Discharge Plan and Services   Discharge Planning Services: CM Consult                                 Social Determinants of Health (SDOH) Interventions     Readmission Risk Interventions No flowsheet data found.

## 2018-10-26 NOTE — Plan of Care (Signed)
  Problem: Education: Goal: Knowledge of the prescribed therapeutic regimen will improve Outcome: Adequate for Discharge Goal: Understanding of activity limitations/precautions following surgery will improve Outcome: Adequate for Discharge Goal: Individualized Educational Video(s) Outcome: Adequate for Discharge   Problem: Activity: Goal: Ability to tolerate increased activity will improve Outcome: Adequate for Discharge   Problem: Pain Management: Goal: Pain level will decrease with appropriate interventions Outcome: Adequate for Discharge   Problem: Education: Goal: Knowledge of General Education information will improve Description: Including pain rating scale, medication(s)/side effects and non-pharmacologic comfort measures Outcome: Adequate for Discharge   Problem: Health Behavior/Discharge Planning: Goal: Ability to manage health-related needs will improve Outcome: Adequate for Discharge   Problem: Clinical Measurements: Goal: Ability to maintain clinical measurements within normal limits will improve Outcome: Adequate for Discharge Goal: Will remain free from infection Outcome: Adequate for Discharge Goal: Diagnostic test results will improve Outcome: Adequate for Discharge Goal: Respiratory complications will improve Outcome: Adequate for Discharge Goal: Cardiovascular complication will be avoided Outcome: Adequate for Discharge   Problem: Activity: Goal: Risk for activity intolerance will decrease Outcome: Adequate for Discharge   Problem: Nutrition: Goal: Adequate nutrition will be maintained Outcome: Adequate for Discharge   Problem: Coping: Goal: Level of anxiety will decrease Outcome: Adequate for Discharge   Problem: Elimination: Goal: Will not experience complications related to bowel motility Outcome: Adequate for Discharge Goal: Will not experience complications related to urinary retention Outcome: Adequate for Discharge   Problem: Pain  Managment: Goal: General experience of comfort will improve Outcome: Adequate for Discharge   Problem: Safety: Goal: Ability to remain free from injury will improve Outcome: Adequate for Discharge   Problem: Skin Integrity: Goal: Risk for impaired skin integrity will decrease Outcome: Adequate for Discharge   

## 2018-12-18 DIAGNOSIS — M47814 Spondylosis without myelopathy or radiculopathy, thoracic region: Secondary | ICD-10-CM | POA: Diagnosis not present

## 2018-12-18 DIAGNOSIS — M7918 Myalgia, other site: Secondary | ICD-10-CM | POA: Diagnosis not present

## 2018-12-18 DIAGNOSIS — M40203 Unspecified kyphosis, cervicothoracic region: Secondary | ICD-10-CM | POA: Diagnosis not present

## 2018-12-18 DIAGNOSIS — M47812 Spondylosis without myelopathy or radiculopathy, cervical region: Secondary | ICD-10-CM | POA: Diagnosis not present

## 2018-12-18 DIAGNOSIS — M47816 Spondylosis without myelopathy or radiculopathy, lumbar region: Secondary | ICD-10-CM | POA: Diagnosis not present

## 2018-12-18 DIAGNOSIS — M4180 Other forms of scoliosis, site unspecified: Secondary | ICD-10-CM | POA: Diagnosis not present

## 2018-12-26 DIAGNOSIS — M47814 Spondylosis without myelopathy or radiculopathy, thoracic region: Secondary | ICD-10-CM | POA: Diagnosis not present

## 2019-02-13 DIAGNOSIS — M5134 Other intervertebral disc degeneration, thoracic region: Secondary | ICD-10-CM | POA: Diagnosis not present

## 2019-02-13 DIAGNOSIS — M47814 Spondylosis without myelopathy or radiculopathy, thoracic region: Secondary | ICD-10-CM | POA: Diagnosis not present

## 2019-03-11 DIAGNOSIS — M5414 Radiculopathy, thoracic region: Secondary | ICD-10-CM | POA: Diagnosis not present

## 2019-03-11 DIAGNOSIS — M5134 Other intervertebral disc degeneration, thoracic region: Secondary | ICD-10-CM | POA: Diagnosis not present

## 2019-03-11 DIAGNOSIS — M40203 Unspecified kyphosis, cervicothoracic region: Secondary | ICD-10-CM | POA: Diagnosis not present

## 2019-03-11 DIAGNOSIS — M4180 Other forms of scoliosis, site unspecified: Secondary | ICD-10-CM | POA: Diagnosis not present

## 2019-03-11 DIAGNOSIS — M47814 Spondylosis without myelopathy or radiculopathy, thoracic region: Secondary | ICD-10-CM | POA: Diagnosis not present

## 2019-08-20 ENCOUNTER — Other Ambulatory Visit: Payer: Self-pay | Admitting: Rehabilitation

## 2019-08-20 ENCOUNTER — Telehealth: Payer: Self-pay | Admitting: Nurse Practitioner

## 2019-08-20 DIAGNOSIS — M4120 Other idiopathic scoliosis, site unspecified: Secondary | ICD-10-CM

## 2019-08-20 NOTE — Telephone Encounter (Signed)
Phone call to patient to verify medication list and allergies for myelogram procedure. Pt instructed to hold trazodone for 48hrs prior to myelogram appointment time. Pt verbalized understanding. Pre and post procedure instructions reviewed with pt.

## 2019-08-26 ENCOUNTER — Ambulatory Visit
Admission: RE | Admit: 2019-08-26 | Discharge: 2019-08-26 | Disposition: A | Discharge status: Home / Self Care | Payer: Medicare Other | Source: Ambulatory Visit | Attending: Rehabilitation | Admitting: Rehabilitation

## 2019-08-26 ENCOUNTER — Other Ambulatory Visit: Payer: Self-pay

## 2019-08-26 DIAGNOSIS — M4186 Other forms of scoliosis, lumbar region: Secondary | ICD-10-CM | POA: Diagnosis not present

## 2019-08-26 DIAGNOSIS — M4120 Other idiopathic scoliosis, site unspecified: Secondary | ICD-10-CM

## 2019-08-26 DIAGNOSIS — M47817 Spondylosis without myelopathy or radiculopathy, lumbosacral region: Secondary | ICD-10-CM | POA: Insufficient documentation

## 2019-08-26 DIAGNOSIS — M4134 Thoracogenic scoliosis, thoracic region: Secondary | ICD-10-CM | POA: Diagnosis not present

## 2019-08-26 DIAGNOSIS — M47812 Spondylosis without myelopathy or radiculopathy, cervical region: Secondary | ICD-10-CM | POA: Diagnosis not present

## 2019-08-26 DIAGNOSIS — G894 Chronic pain syndrome: Secondary | ICD-10-CM | POA: Insufficient documentation

## 2019-08-26 DIAGNOSIS — M5136 Other intervertebral disc degeneration, lumbar region: Secondary | ICD-10-CM | POA: Insufficient documentation

## 2019-08-26 DIAGNOSIS — M47814 Spondylosis without myelopathy or radiculopathy, thoracic region: Secondary | ICD-10-CM | POA: Insufficient documentation

## 2019-08-26 DIAGNOSIS — M5126 Other intervertebral disc displacement, lumbar region: Secondary | ICD-10-CM | POA: Diagnosis not present

## 2019-08-26 DIAGNOSIS — M48061 Spinal stenosis, lumbar region without neurogenic claudication: Secondary | ICD-10-CM | POA: Diagnosis not present

## 2019-08-26 MED ORDER — DIAZEPAM 5 MG PO TABS
5.0000 mg | ORAL_TABLET | Freq: Once | ORAL | Status: AC
  Administered 2019-08-26: 5 mg via ORAL

## 2019-08-26 MED ORDER — IOHEXOL 350 MG/ML SOLN
10.0000 mL | Freq: Once | INTRAVENOUS | Status: AC | PRN
  Administered 2019-08-26: 10 mL via INTRATHECAL

## 2019-08-26 NOTE — Discharge Instructions (Signed)
Myelogram Discharge Instructions  1. Go home and rest quietly for the next 24 hours.  It is important to lie flat for the next 24 hours.  Get up only to go to the restroom.  You may lie in the bed or on a couch on your back, your stomach, your left side or your right side.  You may have one pillow under your head.  You may have pillows between your knees while you are on your side or under your knees while you are on your back.  2. DO NOT drive today.  Recline the seat as far back as it will go, while still wearing your seat belt, on the way home.  3. You may get up to go to the bathroom as needed.  You may sit up for 10 minutes to eat.  You may resume your normal diet and medications unless otherwise indicated.  Drink plenty of extra fluids today and tomorrow.  4. The incidence of a spinal headache with nausea and/or vomiting is about 5% (one in 20 patients).  If you develop a headache, lie flat and drink plenty of fluids until the headache goes away.  Caffeinated beverages may be helpful.  If you develop severe nausea and vomiting or a headache that does not go away with flat bed rest, call [phone].  5. You may resume normal activities after your 24 hours of bed rest is over; however, do not exert yourself strongly or do any heavy lifting tomorrow.  6. Call your physician for a follow-up appointment.    You may resume Trazodone on Tuesday, August 27, 2019 after 10:30a.m.

## 2019-08-26 NOTE — Progress Notes (Signed)
Patient states he has been off Trazodone for at least the past two days. 

## 2019-09-09 DIAGNOSIS — M47814 Spondylosis without myelopathy or radiculopathy, thoracic region: Secondary | ICD-10-CM | POA: Diagnosis not present

## 2019-09-09 DIAGNOSIS — M47816 Spondylosis without myelopathy or radiculopathy, lumbar region: Secondary | ICD-10-CM | POA: Diagnosis not present

## 2019-09-09 DIAGNOSIS — M4120 Other idiopathic scoliosis, site unspecified: Secondary | ICD-10-CM | POA: Diagnosis not present

## 2019-09-16 DIAGNOSIS — M47812 Spondylosis without myelopathy or radiculopathy, cervical region: Secondary | ICD-10-CM | POA: Diagnosis not present

## 2019-10-29 ENCOUNTER — Other Ambulatory Visit: Payer: Self-pay

## 2019-10-29 ENCOUNTER — Ambulatory Visit
Admission: EM | Admit: 2019-10-29 | Discharge: 2019-10-29 | Disposition: A | Discharge status: Home / Self Care | Payer: Medicare Other

## 2019-10-29 ENCOUNTER — Emergency Department (HOSPITAL_COMMUNITY)
Admission: EM | Admit: 2019-10-29 | Discharge: 2019-10-29 | Discharge status: Against Medical Advice | Payer: No Typology Code available for payment source

## 2019-10-29 ENCOUNTER — Encounter: Payer: Self-pay | Admitting: Emergency Medicine

## 2019-10-29 NOTE — ED Notes (Signed)
Patient is being discharged from the Urgent Care and sent to the Emergency Department via pov. Per Nile Riggs patient is in need of higher level of care due to syncopal episode. Patient is aware and verbalizes understanding of plan of care.  Vitals:   10/29/19 1059  BP: (!) 185/109  Pulse: 82  Resp: 18  Temp: 98.8 F (37.1 C)  SpO2: 95%

## 2019-10-29 NOTE — ED Triage Notes (Signed)
Pt states he had a syncopal episode sometime this morning.  Pt has laceration to upper lip, bruise to LT shoulder, laceration to bridge of nose. Pt states he had episode like this x 10 years ago d/t his bp dropped low when he stood up too fast.

## 2019-12-18 DIAGNOSIS — Z23 Encounter for immunization: Secondary | ICD-10-CM | POA: Diagnosis not present

## 2020-07-29 DIAGNOSIS — M47812 Spondylosis without myelopathy or radiculopathy, cervical region: Secondary | ICD-10-CM | POA: Insufficient documentation

## 2020-11-12 IMAGING — XA DG MYELOGRAPHY LUMBAR INJ MULTI REGION
12 of 22 series · 12 of 22 positions shown · non-contrast
Comparison: CT abdomen pelvis dated October 15, 2014. CT chest
dated March 06, 2014.

CLINICAL DATA: Chronic diffuse back pain without radiculopathy.
Scoliosis. No prior surgeries.
TECHNIQUE: Contiguous axial images were obtained through the thoracic and
lumbar spine after the intrathecal infusion of contrast. Coronal and
sagittal reconstructions were obtained of the axial image sets.

[Series 1: vasc standard · 1 of 1 slices shown (1 of 11)]
[im 1/1]
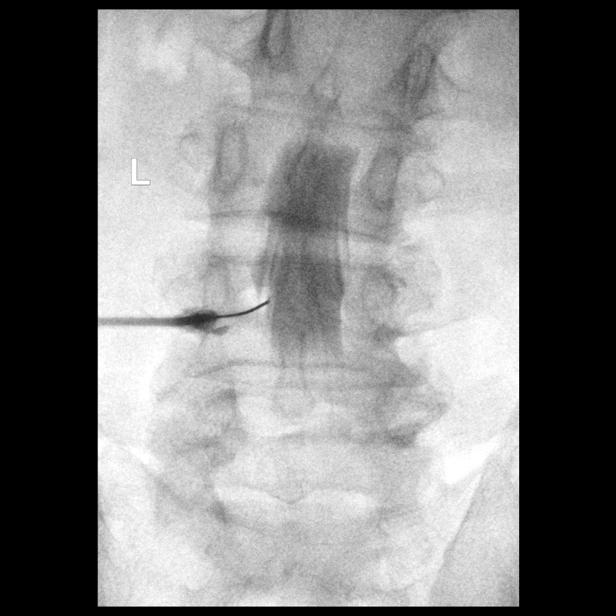

[Series 2: w lumbar spine flexion · 0.15mm/px · 1 of 1 slices shown]
[im 1/1]
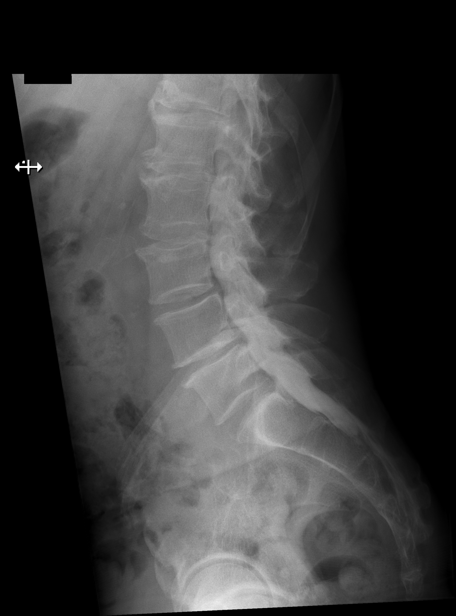

[Series 3: vasc standard · 1 of 1 slices shown (2 of 11)]
[im 1/1]
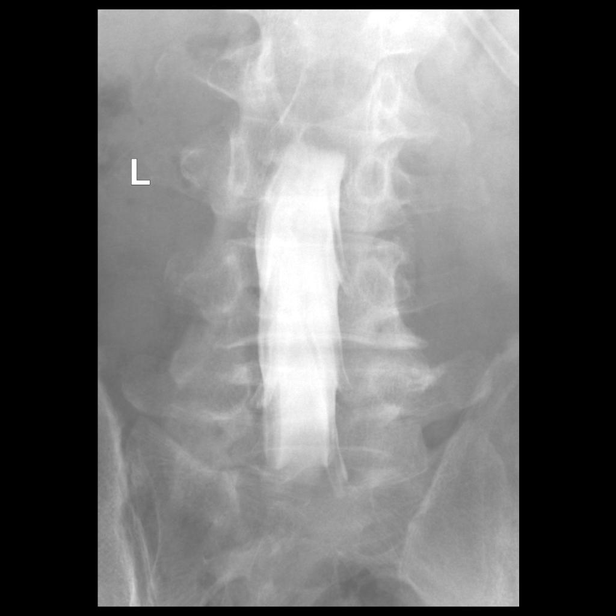

[Series 4: vasc standard · 1 of 1 slices shown (3 of 11)]
[im 1/1]
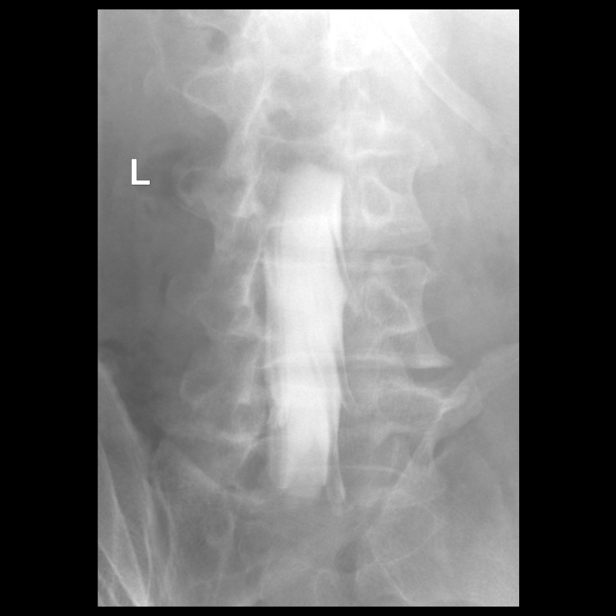

[Series 6: vasc standard · 1 of 1 slices shown (4 of 11)]
[im 1/1]
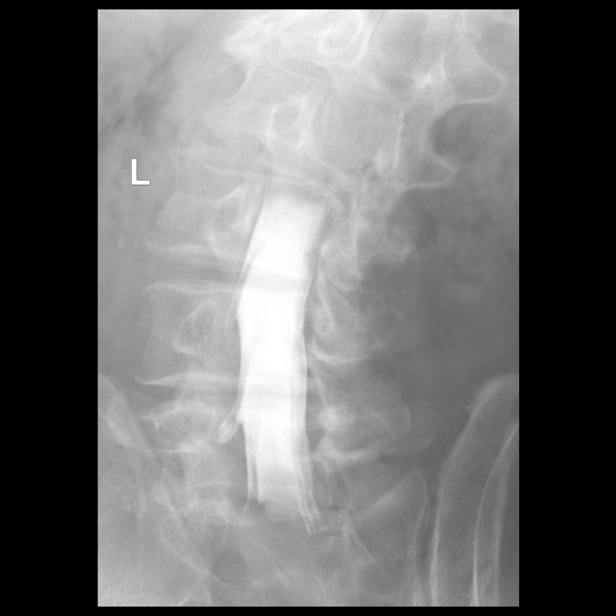

[Series 8: vasc standard · 1 of 1 slices shown (5 of 11)]
[im 1/1]
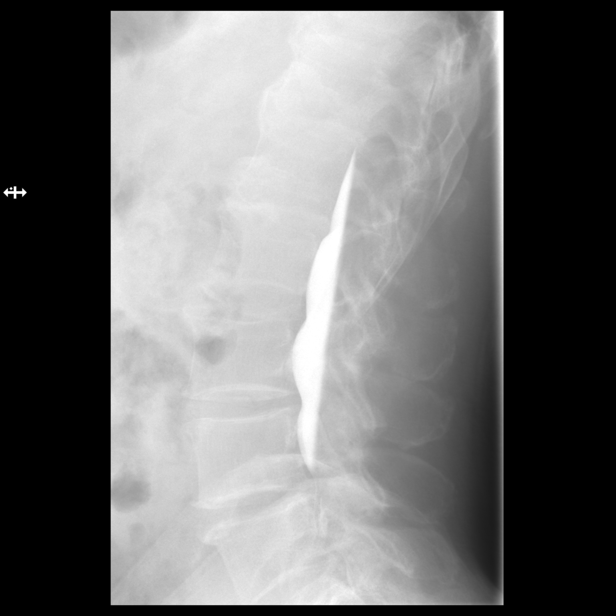

[Series 9: vasc standard · 1 of 1 slices shown (6 of 11)]
[im 1/1]
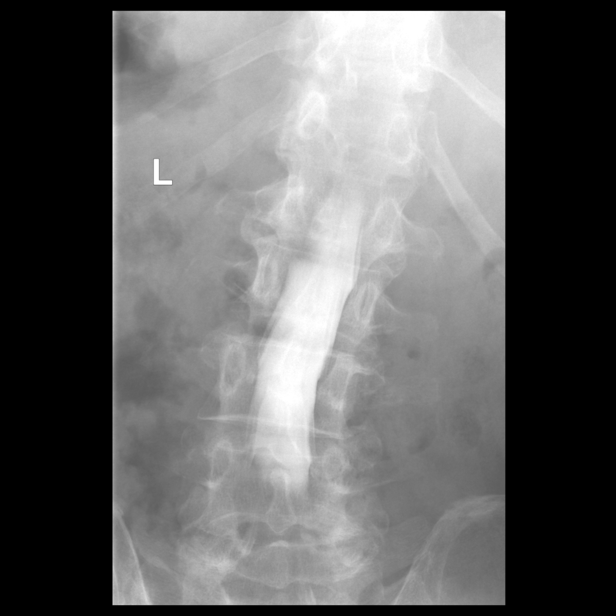

[Series 11: vasc standard · 1 of 1 slices shown (7 of 11)]
[im 1/1]
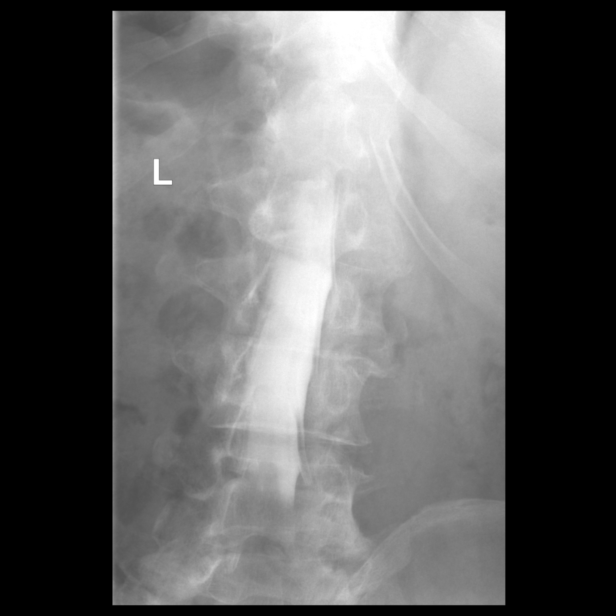

[Series 13: vasc standard · 1 of 1 slices shown (8 of 11)]
[im 1/1]
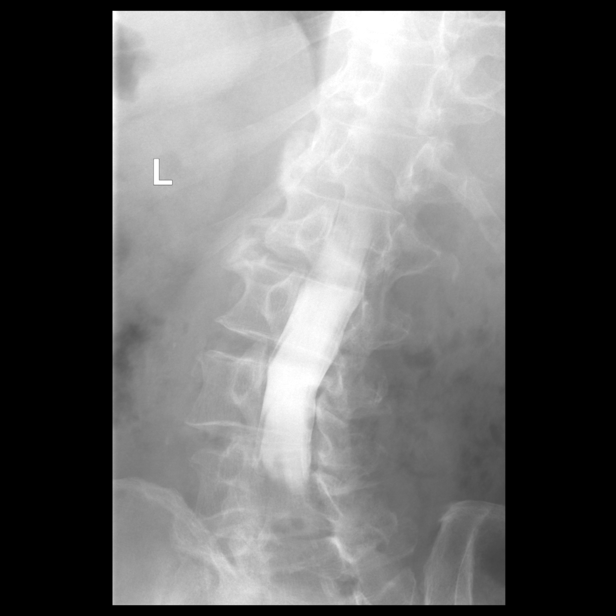

[Series 15: vasc standard · 1 of 1 slices shown (9 of 11)]
[im 1/1]
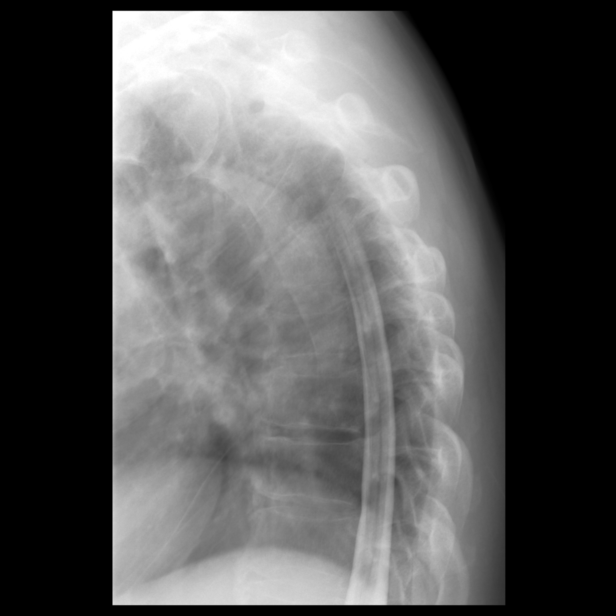

[Series 17: vasc standard · 1 of 1 slices shown (10 of 11)]
[im 1/1]
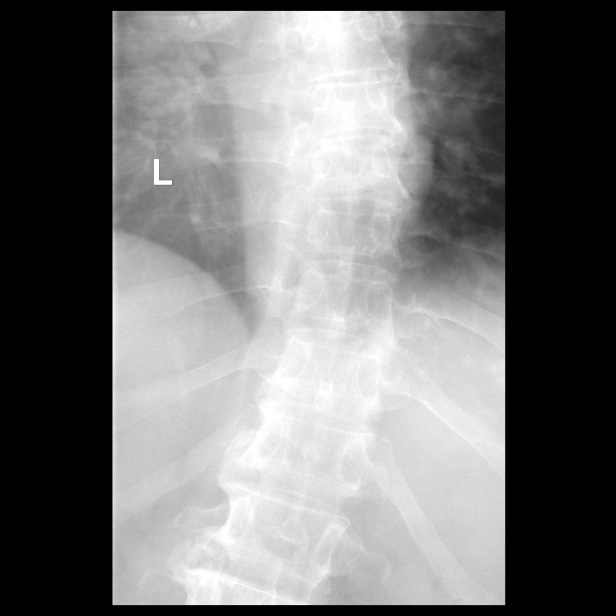

[Series 19: vasc standard · 1 of 1 slices shown (11 of 11)]
[im 1/1]
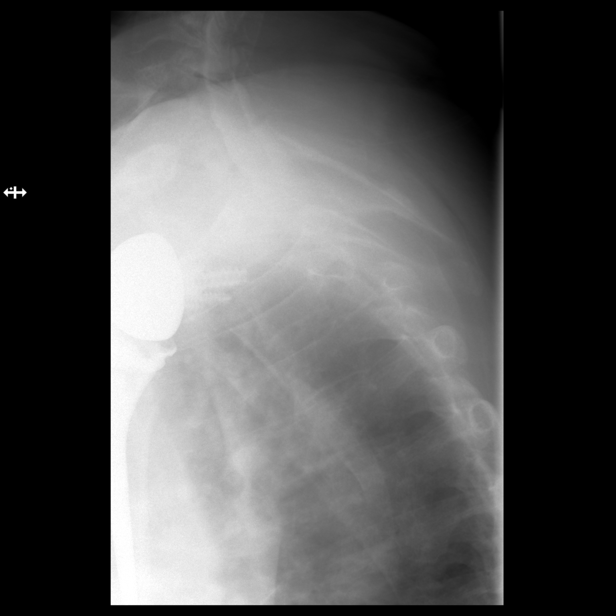

[12 of 22 positions shown; findings below may reference images not displayed]

EXAM:
THORACIC AND LUMBAR MYELOGRAM

CT THORACIC MYELOGRAM

CT LUMBAR MYELOGRAM

FLUOROSCOPY TIME:  Radiation Exposure Index (as provided by the
fluoroscopic device): 21.0 mGy

Fluoroscopy Time:  1 minute, 17 seconds

Number of Acquired Images:  21

PROCEDURE:
LUMBAR PUNCTURE FOR THORACIC AND LUMBAR MYELOGRAM

After thorough discussion of risks and benefits of the procedure
including bleeding, infection, injury to nerves, blood vessels,
adjacent structures as well as headache and CSF leak, written and
oral informed consent was obtained. Consent was obtained by Dr.
Qomandan Tiger.

Patient was positioned prone on the fluoroscopy table. Local
anesthesia was provided with 1% lidocaine without epinephrine after
prepped and draped in the usual sterile fashion. Puncture was
performed at L3-L4 using a 3 1/2 inch 22-gauge spinal needle via
left interlaminar approach. Using a single pass through the dura,
the needle was placed within the thecal sac, with return of clear
CSF. 10 mL Isovue 5-HII was injected into the thecal sac, with
normal opacification of the nerve roots and cauda equina consistent
with free flow within the subarachnoid space. The patient was then
moved to the trendelenburg position and contrast flowed into the
thoracic spine region.

I personally performed the lumbar puncture and administered the
intrathecal contrast. I also personally supervised acquisition of
the myelogram images.
FINDINGS: THORACIC AND LUMBAR MYELOGRAM FINDINGS:

Thoracic spine: Mild-to-moderate dextroscoliosis. No listhesis. No
significant ventral extradural defect. No spinal canal stenosis.

Lumbar spine: Mild dextroscoliosis of the upper lumbar spine. Trace
retrolisthesis at L2-L3. No dynamic instability. Small ventral
extradural defects from L1-L2 through L5-S1. No spinal canal
stenosis or nerve root effacement.

CT THORACIC MYELOGRAM FINDINGS:

Alignment: Mild-to-moderate dextroscoliosis measuring 24 degrees,
apex at T8-T9. No listhesis.

Vertebrae: No acute fracture or other focal pathologic process.

Cord: Normal bulk and morphology.

Paraspinal and other soft tissues: Prior thyroidectomy. Calcified
mediastinal and bilateral hilar lymph nodes. A few small pulmonary
nodules in both lungs are unchanged since 3085, benign. Mild aortic
atherosclerosis.

Disc levels: Mild degenerative disc disease and facet arthropathy in
the lower cervical spine without stenosis.

No significant disc bulge or herniation in the thoracic spine. Mild
right facet arthropathy at T4-T5 and T5-T6. Mild anterior endplate
spurring throughout the thoracic spine. No spinal canal or
neuroforaminal stenosis.

CT LUMBAR MYELOGRAM FINDINGS:

Segmentation: Standard.

Alignment: Mild dextroscoliosis of the upper lumbar spine. Trace
retrolisthesis at L2-L3.

Vertebrae: No acute fracture or other focal pathologic process.

Conus medullaris and cauda equina: Conus extends to the L1 level.
Conus and cauda equina appear normal.

Paraspinal and other soft tissues: 1.4 cm intermediate density in
the right kidney is unchanged in size compared to 3085, favoring
benign complicated cyst. Other partially visualized simple cysts in
both kidneys have increased in size since 3085. Mild aortoiliac
atherosclerotic calcification.

Disc levels:

T12-L1:  Negative.

L1-L2: Mild disc bulging and bilateral facet arthropathy. No
stenosis.

L2-L3: Mild disc bulging and bilateral facet arthropathy. No
stenosis.

L3-L4: Mild disc bulging, endplate spurring, and right greater than
left facet arthropathy. Moderate right neuroforaminal stenosis. No
spinal canal left neuroforaminal stenosis.

L4-L5: Mild disc bulging, endplate spurring, and right greater than
left facet arthropathy. Mild to moderate right and mild left
neuroforaminal stenosis. No spinal canal stenosis.

L5-S1: Mild disc bulging and moderate left facet arthropathy. No
stenosis.
IMPRESSION: THORACIC SPINE:

1. Mild-to-moderate thoracolumbar dextroscoliosis.
2. Mild multilevel thoracic spondylosis without stenosis or
impingement.

LUMBAR SPINE:

1. Mild-to-moderate multilevel lumbar spondylosis as described
above. Moderate right neuroforaminal stenosis at L3-L4.
2. Mild to moderate right and mild left neuroforaminal stenosis at
L4-L5.
3. Aortic Atherosclerosis ([id number]).

## 2021-03-03 LAB — PULMONARY FUNCTION TEST

## 2021-09-01 ENCOUNTER — Encounter: Payer: Self-pay | Admitting: Internal Medicine

## 2021-09-01 ENCOUNTER — Ambulatory Visit (INDEPENDENT_AMBULATORY_CARE_PROVIDER_SITE_OTHER): Payer: No Typology Code available for payment source | Admitting: Internal Medicine

## 2021-09-01 ENCOUNTER — Ambulatory Visit (INDEPENDENT_AMBULATORY_CARE_PROVIDER_SITE_OTHER): Payer: Medicare Other

## 2021-09-01 DIAGNOSIS — R0609 Other forms of dyspnea: Secondary | ICD-10-CM

## 2021-09-01 DIAGNOSIS — R0600 Dyspnea, unspecified: Secondary | ICD-10-CM | POA: Diagnosis not present

## 2021-09-01 LAB — CBC WITH DIFFERENTIAL/PLATELET
Basophils Absolute: 0 10*3/uL (ref 0.0–0.1)
Basophils Relative: 0.4 % (ref 0.0–3.0)
Eosinophils Absolute: 0.1 10*3/uL (ref 0.0–0.7)
Eosinophils Relative: 2.8 % (ref 0.0–5.0)
HCT: 34.4 % — ABNORMAL LOW (ref 39.0–52.0)
Hemoglobin: 11.7 g/dL — ABNORMAL LOW (ref 13.0–17.0)
Lymphocytes Relative: 13.4 % (ref 12.0–46.0)
Lymphs Abs: 0.4 10*3/uL — ABNORMAL LOW (ref 0.7–4.0)
MCHC: 34.1 g/dL (ref 30.0–36.0)
MCV: 94.2 fl (ref 78.0–100.0)
Monocytes Absolute: 0.3 10*3/uL (ref 0.1–1.0)
Monocytes Relative: 11.2 % (ref 3.0–12.0)
Neutro Abs: 2.2 10*3/uL (ref 1.4–7.7)
Neutrophils Relative %: 72.2 % (ref 43.0–77.0)
Platelets: 120 10*3/uL — ABNORMAL LOW (ref 150.0–400.0)
RBC: 3.65 Mil/uL — ABNORMAL LOW (ref 4.22–5.81)
RDW: 14.1 % (ref 11.5–15.5)
WBC: 3.1 10*3/uL — ABNORMAL LOW (ref 4.0–10.5)

## 2021-09-01 LAB — BRAIN NATRIURETIC PEPTIDE: Pro B Natriuretic peptide (BNP): 22 pg/mL (ref 0.0–100.0)

## 2021-09-01 LAB — SEDIMENTATION RATE: Sed Rate: 6 mm/hr (ref 0–20)

## 2021-09-01 NOTE — Assessment & Plan Note (Addendum)
Onset around summer of 2023 / progressive  - PFT's  03/03/21   FEV1 2.45  (91 % ) ratio 0.76  And no % improvement from saba p nothing  prior to study with DLCO  24.54 (81%)  and FV curve nl   - 09/01/2021   Walked on 3  x  3  lap(s) =  approx 750  ft  @  nl pace, stopped due to end of study, min sob s cp  with lowest 02 sats 95%   Symptoms are markedly disproportionate to objective findings and not clear to what extent this is actually a pulmonary  problem but pt does appear to have difficult to sort out respiratory symptoms of unknown origin for which  DDX  = almost all start with A and  include Adherence, Ace Inhibitors, Acid Reflux, Active Sinus Disease, Alpha 1 Antitripsin deficiency, Anxiety masquerading as Airways dz,  ABPA,  Allergy(esp in young), Aspiration (esp in elderly), Adverse effects of meds,  Active smoking or Vaping, A bunch of PE's/clot burden (a few small clots can't cause this syndrome unless there is already severe underlying pulm or vascular dz with poor reserve),  Anemia or thyroid disorder, Alternate dx,  plus two Bs  = Bronchiectasis and Beta blocker use..and one C= CHF     Adherence is always the initial "prime suspect" and is a multilayered concern that requires a "trust but verify" approach in every patient - starting with knowing how to use medications, especially inhalers, correctly, keeping up with refills and understanding the fundamental difference between maintenance and prns vs those medications only taken for a very short course and then stopped and not refilled.   ? Allergy /asthma > pfts wnl while symptomatic so this is not not likely at all   ? Adverse drug effects > none of the usual suspects listed  ? Alpha one AT >  Very rare in AA and pfts nl   ? Anemia/ thryoid dz  - [facility]ably reduced with pattern of mild pancytopenia already [facility]  Lab Results  Component Value Date   HGB 11.7 (L) 09/01/2021   HGB 13.2  10/18/2018   HGB 12.1 (L) 10/15/2014     ? Anxiety/depression deconditioning  > usually at the bottom of this list of usual suspects but  note already on psychotropics and may interfere with adherence and also interpretation of response or lack thereof to symptom management which can be quite subjective.  >>> reconditioning advised see avs for instructions unique to this ov    ? A bunch of PEs D dimer nl - while a normal   may miss small peripheral pe, the clot burden with sob is moderately high and the d dimer  has a very high neg pred value if used in this setting.    ?  Alternate dx eg Sarcoidosis :   A good rule of thumb is that >95% of pts with active sarcoid in any organ will have some plain cxr changes - on the other hand  if there are active pulmonary symptoms the cxr will look much worse than the patient:  No evidence of either scenario here/ strongly doubt active dz    ? CHF/cardiac asthma/ ihd> CM on cxr/ [facility] "improved" but doesn't say what the problem is.    F/u in  6 weeks  in [place]    Each maintenance medication was reviewed in  detail including emphasizing most importantly the difference between maintenance and prns and under what circumstances the prns are to be triggered using an action plan format where appropriate.  Total time for H and P, chart review, counseling,  directly observing portions of ambulatory 02 saturation study/ and generating customized AVS unique to this office visit / same day charting = 60 min new pt eval with [facility] review

## 2021-09-01 NOTE — Patient Instructions (Addendum)
Please remember to go to the lab and x-ray department  for your tests - we will call you with the results when they are available.      Stay as active as you can  - it's ok to be a little short of breath with exertion, but pace yourself a bit slower and use flat/ air conditioned surfaces to do this like a [employer]   Please schedule a follow up office visit in 6 weeks, call sooner if needed - [place] office

## 2021-09-01 NOTE — Progress Notes (Signed)
Douglas Hardy, male    DOB: [birth year]   MRN: [id number]   Brief patient profile:  71 yobm from [place]  quit smoking 1975  referred to pulmonary clinic 09/01/2021 by Thayer Dallas  for sob in setting of sarcoid dx in 2000  [place] by mediastinisopy no symptoms at all and rash resolved       History of Present Illness  09/01/2021  Pulmonary/ 1st office eval/  Chief Complaint  Patient presents with   Consult    Sarcoidosis since 2000, had skin lesions.  No respiratory symptoms until 1 year ago.  Now sob all the time.  Sob mostly with exertion.  Dyspnea:  onset was insidious and progressive was 5 miles a day s stopping but then room to room  Cough: for years even before 2000 but mostly in am before brush teeth, year round, resolves with clearing throat takes up to min s sinus component Sleep: able to lie flat one pillow  SABA use: none    Other than worse in hot weather and hills, no obvious day to day or daytime  variability or assoc excess/ purulent sputum or mucus plugs or hemoptysis or cp or chest tightness, subjective wheeze or overt sinus or hb symptoms.   sleeps without nocturnal  or early am exacerbation  of respiratory  c/o's or need for noct saba. Also denies any obvious fluctuation of symptoms with weather or environmental changes or other aggravating or alleviating factors except as outlined above   No unusual exposure hx or h/o childhood pna/ asthma or knowledge of premature birth.  Current Allergies, Complete Past Medical History, Past Surgical History, Family History, and Social History were reviewed in [employer] record.  ROS  The following are not active complaints unless bolded Hoarseness, sore throat, dysphagia, dental problems, itching, sneezing,  nasal congestion or discharge of excess mucus or purulent secretions, ear ache,   fever, chills, sweats, unintended wt loss or wt gain, classically pleuritic or exertional cp,  orthopnea pnd or  arm/hand swelling  or leg swelling, presyncope, palpitations, abdominal pain, anorexia, nausea, vomiting, diarrhea  or change in bowel habits or change in bladder habits, change in stools or change in urine, dysuria, hematuria,  rash, arthralgias, visual complaints, headache, numbness, weakness or ataxia or problems with walking or coordination,  change in mood or  memory.           Past Medical History:  Diagnosis Date   Arthritis    Chronic back pain    Hypertension    Sleep apnea    uses CPAP    Outpatient Medications Prior to Visit  Medication Sig Dispense Refill   acetaminophen (TYLENOL) 500 MG tablet Take 500 mg by mouth 3 (three) times daily.     diclofenac sodium (VOLTAREN) 1 % GEL Apply 1 application topically 3 (three) times daily as needed (pain).     doxazosin (CARDURA) 4 MG tablet Take 2 mg by mouth at bedtime.     gabapentin (NEURONTIN) 600 MG tablet Take 1,200 mg by mouth 3 (three) times daily.     HYDROcodone-acetaminophen (NORCO/VICODIN) 5-325 MG tablet 1 tablet as needed     levothyroxine (SYNTHROID) 125 MCG tablet Take 125 mcg by mouth daily before breakfast.     losartan (COZAAR) 50 MG tablet Take 50 mg by mouth daily.     Multiple Vitamins-Minerals (SUPER THERA VITE M) TABS Take by mouth.     sertraline (ZOLOFT) 50 MG tablet Take 1 tablet by mouth  daily.     traZODone (DESYREL) 50 MG tablet Take 50 mg by mouth at bedtime.     vitamin B-12 (CYANOCOBALAMIN) 500 MCG tablet Take 500 mcg by mouth daily.     chlorthalidone (HYGROTON) 25 MG tablet Take 12.5 mg by mouth daily as needed (if systolic blood pressure over 125).  (Patient not taking: Reported on 09/01/2021)     ondansetron (ZOFRAN) 4 MG tablet Take 1 tablet (4 mg total) by mouth every 8 (eight) hours as needed for nausea or vomiting. (Patient not taking: Reported on 08/20/2019) 10 tablet 0   oxyCODONE-acetaminophen (PERCOCET) 5-325 MG tablet Take 1 tablet by mouth every 4 (four) hours as needed (max 6 q). (Patient  not taking: Reported on 09/01/2021) 20 tablet 0   tiZANidine (ZANAFLEX) 4 MG tablet Take 4 mg by mouth at bedtime.  (Patient not taking: Reported on 09/01/2021)     No facility-administered medications prior to visit.     Objective:     BP 104/70 (BP Location: Left Arm, Patient Position: Sitting, Cuff Size: Large)   Pulse 81   Temp 98 F (36.7 C) (Oral)   Ht 5\' 6"  (1.676 m)   Wt 175 lb 9.6 oz (79.7 kg)   SpO2 97%   BMI 28.34 kg/m   SpO2: 97 %  Somber amb bm nad   HEENT : Oropharynx  clear     Nasal turbinates nl   NECK :  without  apparent JVD/ palpable Nodes/TM    LUNGS: no acc muscle use,  scoliotic chest wall   but  clear to A and P bilaterally without cough on insp or exp maneuvers   CV:  RRR  no s3 or murmur or increase in P2, and no edema   ABD:  soft and nontender with nl inspiratory excursion in the supine position. No bruits or organomegaly appreciated   MS:  Nl gait/ ext warm without deformities Or obvious joint restrictions  calf tenderness, cyanosis or clubbing    SKIN: warm and dry without lesions    NEURO:  alert, approp, nl sensorium with  no motor or cerebellar deficits apparent.    CXR PA and Lateral:   09/01/2021 :    I personally reviewed images and impression is as follows:  Mild CM/ dextroscoliosis/kyphosis  No ild or obvious sarcoid    Labs ordered/ reviewed:      Chemistry      Component Value Date/Time   NA 139 10/18/2018 0847   K 4.7 10/18/2018 0847   CL 103 10/18/2018 0847   CO2 27 10/18/2018 0847   BUN 13 10/18/2018 0847   CREATININE 0.91 10/18/2018 0847      Component Value Date/Time   CALCIUM 9.0 10/18/2018 0847   ALKPHOS 40 10/15/2014 1317   AST 21 10/15/2014 1317   ALT 12 (L) 10/15/2014 1317   BILITOT 0.8 10/15/2014 1317        Lab Results  Component Value Date   WBC 3.1 (L) 09/01/2021   HGB 11.7 (L) 09/01/2021   HCT 34.4 (L) 09/01/2021   MCV 94.2 09/01/2021   PLT 120.0 (L) 09/01/2021     Lab Results   Component Value Date   DDIMER 0.49 09/01/2021        Lab Results  Component Value Date   PROBNP 22.0 09/01/2021       Lab Results  Component Value Date   ESRSEDRATE 6 09/01/2021  Assessment   DOE (dyspnea on exertion) Onset around summer of 2023 / progressive  - PFT's  03/03/21   FEV1 2.45  (91 % ) ratio 0.76  And no % improvement from saba p nothing  prior to study with DLCO  24.54 (81%)  and FV curve nl   - 09/01/2021   Walked on 3  x  3  lap(s) =  approx 750  ft  @  nl pace, stopped due to end of study, min sob s cp  with lowest 02 sats 95%   Symptoms are markedly disproportionate to objective findings and not clear to what extent this is actually a pulmonary  problem but pt does appear to have difficult to sort out respiratory symptoms of unknown origin for which  DDX  = almost all start with A and  include Adherence, Ace Inhibitors, Acid Reflux, Active Sinus Disease, Alpha 1 Antitripsin deficiency, Anxiety masquerading as Airways dz,  ABPA,  Allergy(esp in young), Aspiration (esp in elderly), Adverse effects of meds,  Active smoking or Vaping, A bunch of PE's/clot burden (a few small clots can't cause this syndrome unless there is already severe underlying pulm or vascular dz with poor reserve),  Anemia or thyroid disorder, Alternate dx,  plus two Bs  = Bronchiectasis and Beta blocker use..and one C= CHF     Adherence is always the initial "prime suspect" and is a multilayered concern that requires a "trust but verify" approach in every patient - starting with knowing how to use medications, especially inhalers, correctly, keeping up with refills and understanding the fundamental difference between maintenance and prns vs those medications only taken for a very short course and then stopped and not refilled.   ? Allergy /asthma > pfts wnl while symptomatic so this is not not likely at all   ? Adverse drug effects > none of the usual suspects listed  ? Alpha one AT >   Very rare in AA and pfts nl   ? Anemia/ thryoid dz  - [facility] but [facility] variably reduced with pattern of mild pancytopenia already [facility] eval per [facility] note  Lab Results  Component Value Date   HGB 11.7 (L) 09/01/2021   HGB 13.2 10/18/2018   HGB 12.1 (L) 10/15/2014     ? Anxiety/depression deconditioning  > usually at the bottom of this list of usual suspects but  note already on psychotropics and may interfere with adherence and also interpretation of response or lack thereof to symptom management which can be quite subjective.  >>> reconditioning advised see avs for instructions unique to this ov    ? A bunch of PEs D dimer nl - while a normal   may miss small peripheral pe, the clot burden with sob is moderately high and the d dimer  has a very high neg pred value if used in this setting.    ?  Alternate dx eg Sarcoidosis :   A good rule of thumb is that >95% of pts with active sarcoid in any organ will have some plain cxr changes - on the other hand  if there are active pulmonary symptoms the cxr will look much worse than the patient:  No evidence of either scenario here/ strongly doubt active dz    ? CHF/cardiac asthma/ ihd> CM on cxr/ [facility] "improved" but doesn't say what the problem is.    F/u in  6 weeks  in [place]  Each maintenance medication was reviewed in detail including emphasizing most importantly the difference between maintenance and prns and under what circumstances the prns are to be triggered using an action plan format where appropriate.  Total time for H and P, chart review, counseling,  directly observing portions of ambulatory 02 saturation study/ and generating customized AVS unique to this office visit / same day charting = 60 min new pt eval with extensive [facility] review                    Sandrea Hughs, MD 09/01/2021

## 2021-09-02 LAB — D-DIMER, QUANTITATIVE: D-Dimer, Quant: 0.49 mcg/mL FEU (ref <0.50)

## 2021-09-02 LAB — ANGIOTENSIN CONVERTING ENZYME: Angiotensin-Converting Enzyme: 34 U/L (ref 9–67)

## 2021-09-03 NOTE — Progress Notes (Signed)
Tried calling the pt and there was no answer- LMTCB.  

## 2021-09-03 NOTE — Progress Notes (Signed)
Called pt and there was no answer-LMTCB °

## 2021-10-18 ENCOUNTER — Ambulatory Visit (INDEPENDENT_AMBULATORY_CARE_PROVIDER_SITE_OTHER): Payer: No Typology Code available for payment source | Admitting: Internal Medicine

## 2021-10-18 ENCOUNTER — Encounter: Payer: Self-pay | Admitting: Internal Medicine

## 2021-10-18 DIAGNOSIS — R058 Other specified cough: Secondary | ICD-10-CM | POA: Diagnosis not present

## 2021-10-18 DIAGNOSIS — R0609 Other forms of dyspnea: Secondary | ICD-10-CM

## 2021-10-18 NOTE — Patient Instructions (Signed)
To get the most out of exercise, you need to be continuously aware that you are short of breath, but never out of breath, for at least 30 minutes daily. As you improve, it will actually be easier for you to do the same amount of exercise  in  30 minutes so always push to the level where you are short of breath.     Make sure you check your oxygen saturations at highest level of activity   Pantoprazole (protonix) 40 mg   Take  30-60 min before first meal of the day and Pepcid (famotidine)  20 mg after supper until return to office - this is the best way to tell whether stomach acid is contributing to your problem.    GERD (REFLUX)  is an extremely common cause of respiratory symptoms just like yours , many times with no obvious heartburn at all.    It can be treated with medication, but also with lifestyle changes including elevation of the head of your bed (ideally with 6 -8inch blocks under the headboard of your bed),  Smoking cessation, avoidance of late meals, excessive alcohol, and avoid fatty foods, chocolate, peppermint, colas, red wine, and acidic juices such as orange juice.  NO MINT OR MENTHOL PRODUCTS SO NO COUGH DROPS  USE SUGARLESS CANDY INSTEAD (Jolley ranchers or Stover's or Life Savers) or even ice chips will also do - the key is to swallow to prevent all throat clearing. NO OIL BASED VITAMINS - use powdered substitutes.  Avoid fish oil when coughing.   Please remember to go to the lab department @ [facility] for your tests - we will call you with the results when they are available.  Please schedule a follow up office visit in 6 weeks, call sooner if needed

## 2021-10-18 NOTE — Progress Notes (Unsigned)
Douglas Hardy, male    DOB: 09/16/1950   MRN: [id number]   Brief patient profile:  71 yobm from [place]  quit smoking 1975  referred to pulmonary clinic 09/01/2021 by Lenn Sink  for sob in setting of sarcoid dx in 2000  [place] by mediastinisopy no symptoms at all and rash resolved       History of Present Illness  09/01/2021  Pulmonary/ 1st office eval/  Chief Complaint  Patient presents with   Consult    Sarcoidosis since 2000, had skin lesions.  No respiratory symptoms until 1 year ago.  Now sob all the time.  Sob mostly with exertion.  Dyspnea:  onset was insidious and progressive was 5 miles a day s stopping but then room to room  Cough: for years even before 2000 but mostly in am before brush teeth, year round, resolves with clearing throat takes up to min s sinus component Sleep: able to lie flat one pillow  SABA use: none  Rec Sub max ex     10/18/2021  f/u ov/[place] office/ re: ? Sarcoid  maint on no rx   Chief Complaint  Patient presents with   Follow-up    Feels breathing is a little worse    Dyspnea:  600 ft driveway uphill to mailbox and stop  Cough: ever day since sarcoid dry daytime Sleeping: lie flat one pllow cpap  SABA use: none  02: none  Covid status: vax x 5      No obvious day to day or daytime variability or assoc excess/ purulent sputum or mucus plugs or hemoptysis or cp or chest tightness, subjective wheeze or overt sinus or hb symptoms.   Sleeping as above  without nocturnal  or early am exacerbation  of respiratory  c/o's or need for noct saba. Also denies any obvious fluctuation of symptoms with weather or environmental changes or other aggravating or alleviating factors except as outlined above   No unusual exposure hx or h/o childhood pna/ asthma or knowledge of premature birth.  Current Allergies, Complete Past Medical History, Past Surgical History, Family History, and Social History were reviewed in [employer] record.  ROS  The following are not active complaints unless bolded Hoarseness, sore throat, dysphagia, dental problems, itching, sneezing,  nasal congestion or discharge of excess mucus or purulent secretions, ear ache,   fever, chills, sweats, unintended wt loss or wt gain, classically pleuritic or exertional cp,  orthopnea pnd or arm/hand swelling  or leg swelling, presyncope, palpitations, abdominal pain, anorexia, nausea, vomiting, diarrhea  or change in bowel habits or change in bladder habits, change in stools or change in urine, dysuria, hematuria,  rash, arthralgias, visual complaints, headache, numbness, weakness or ataxia or problems with walking or coordination,  change in mood or  memory.        Current Meds  Medication Sig   acetaminophen (TYLENOL) 500 MG tablet Take 500 mg by mouth 3 (three) times daily.   diclofenac sodium (VOLTAREN) 1 % GEL Apply 1 application topically 3 (three) times daily as needed (pain).   doxazosin (CARDURA) 4 MG tablet Take 2 mg by mouth at bedtime.   gabapentin (NEURONTIN) 600 MG tablet Take 1,200 mg by mouth 3 (three) times daily.   HYDROcodone-acetaminophen (NORCO/VICODIN) 5-325 MG tablet 1 tablet as needed   levothyroxine (SYNTHROID) 125 MCG tablet Take 125 mcg by mouth daily before breakfast.   losartan (COZAAR) 50 MG tablet Take 50 mg by mouth daily.  Multiple Vitamins-Minerals (SUPER THERA VITE M) TABS Take by mouth.   sertraline (ZOLOFT) 50 MG tablet Take 1 tablet by mouth daily.   simvastatin (ZOCOR) 10 MG tablet TAKE ONE TABLET BY MOUTH AT BEDTIME FOR CHOLESTEROL   traZODone (DESYREL) 50 MG tablet Take 50 mg by mouth at bedtime.   vitamin B-12 (CYANOCOBALAMIN) 500 MCG tablet Take 500 mcg by mouth daily.               Past Medical History:  Diagnosis Date   Arthritis    Chronic back pain    Hypertension    Sleep apnea    uses CPAP       Objective:    Wt Readings from Last 3 Encounters:  10/18/21 182 lb 9.6 oz (82.8 kg)   09/01/21 175 lb 9.6 oz (79.7 kg)  10/29/19 171 lb (77.6 kg)    Vital signs reviewed  10/18/2021  - Note at rest 02 sats  ***% on ***   General appearance:    amb bm naad      HEENT : Oropharynx  clear     Nasal turbinates nl    NECK :  without  apparent JVD/ palpable Nodes/TM    LUNGS: no acc muscle use, Mod scoliotic contour chest which is clear to A and P bilaterally without cough on insp or exp maneuvers   CV:  RRR  no s3 or murmur or increase in P2, and no edema   ABD:  soft and nontender with nl inspiratory excursion in the supine position. No bruits or organomegaly appreciated   MS:  Nl gait/ ext warm without deformities Or obvious joint restrictions  calf tenderness, cyanosis or clubbing    SKIN: warm and dry with  nickel sized "boil"   NEURO:  alert, approp, nl sensorium with  no motor or cerebellar deficits apparent.        Lab Results  Component Value Date   WBC 3.1 (L) 09/01/2021   HGB 11.7 (L) 09/01/2021   HCT 34.4 (L) 09/01/2021   MCV 94.2 09/01/2021   PLT 120.0 (L) 09/01/2021      Labs ordered 10/18/2021  :  allergy profile   alpha one AT phenotype  and ACE level                Assessment

## 2021-10-20 ENCOUNTER — Encounter: Payer: Self-pay | Admitting: Internal Medicine

## 2021-10-20 DIAGNOSIS — R058 Other specified cough: Secondary | ICD-10-CM | POA: Insufficient documentation

## 2021-10-20 LAB — CBC WITH DIFFERENTIAL/PLATELET
Basophils Absolute: 0 10*3/uL (ref 0.0–0.2)
Basos: 1 %
EOS (ABSOLUTE): 0.1 10*3/uL (ref 0.0–0.4)
Eos: 3 %
Hematocrit: 35.1 % — ABNORMAL LOW (ref 37.5–51.0)
Hemoglobin: 11.8 g/dL — ABNORMAL LOW (ref 13.0–17.7)
Immature Grans (Abs): 0 10*3/uL (ref 0.0–0.1)
Immature Granulocytes: 0 %
Lymphocytes Absolute: 0.6 10*3/uL — ABNORMAL LOW (ref 0.7–3.1)
Lymphs: 19 %
MCH: 31.5 pg (ref 26.6–33.0)
MCHC: 33.6 g/dL (ref 31.5–35.7)
MCV: 94 fL (ref 79–97)
Monocytes Absolute: 0.4 10*3/uL (ref 0.1–0.9)
Monocytes: 13 %
Neutrophils Absolute: 1.9 10*3/uL (ref 1.4–7.0)
Neutrophils: 64 %
Platelets: 111 10*3/uL — ABNORMAL LOW (ref 150–450)
RBC: 3.75 x10E6/uL — ABNORMAL LOW (ref 4.14–5.80)
RDW: 13.1 % (ref 11.6–15.4)
WBC: 3 10*3/uL — ABNORMAL LOW (ref 3.4–10.8)

## 2021-10-20 LAB — IGE: IgE (Immunoglobulin E), Serum: 47 IU/mL (ref 6–495)

## 2021-10-20 LAB — ANGIOTENSIN CONVERTING ENZYME: Angio Convert Enzyme: 42 U/L (ref 14–82)

## 2021-10-20 NOTE — Assessment & Plan Note (Addendum)
Onset around summer of 2023 / progressive  - PFT's  03/03/21   FEV1 2.45  (91 % ) ratio 0.76  And no % improvement from saba p nothing  prior to study with DLCO  24.54 (81%)  and FV curve nl   - 09/01/2021   Walked on 3  x  3  lap(s) =  approx 750  ft  @  nl pace, stopped due to end of study, min sob s cp  with lowest 02 sats 95%  - ACE 09/01/21   = 34 / esr 6  - ACE 10/18/2021 = 47  - Allergy screen 10/18/2021 >  Eos 0.1 /  IgE  47  I am troubled by his mild progressive pancytopenia but his hgb is relatively stable to no reason when his doe should be worsed base on this finding > referred back to [place] for f/u of cbc  In terms of doe, encouraged regular submax ex monitoring sats at peak and f/u here in 6 weeks

## 2021-10-20 NOTE — Assessment & Plan Note (Signed)
Onset 2000 with dx of sarcoid but ? Related  - rx for gerd   10/18/2021 >>>  Upper airway cough syndrome (previously labeled PNDS),  is so named because it's frequently impossible to sort out how much is  CR/sinusitis with freq throat clearing (which can be related to primary GERD)   vs  causing  secondary (" extra esophageal")  GERD from wide swings in gastric pressure that occur with throat clearing, often  promoting self use of mint and menthol lozenges that reduce the lower esophageal sphincter tone and exacerbate the problem further in a cyclical fashion.   These are the same pts (now being labeled as having "irritable larynx syndrome" by some cough centers) who not infrequently have a history of having failed to tolerate ace inhibitors,  dry powder inhalers or biphosphonates or report having atypical/extraesophageal reflux symptoms that don't respond to standard doses of PPI  and are easily confused as having aecopd or asthma flares by even experienced allergists/ pulmonologists (myself included).  rec trial of gerd rx and return in 6 weeks         Each maintenance medication was reviewed in detail including emphasizing most importantly the difference between maintenance and prns and under what circumstances the prns are to be triggered using an action plan format where appropriate.  Total time for H and P, chart review, counseling,  and generating customized AVS unique to this office visit / same day charting = 35 min

## 2021-11-29 ENCOUNTER — Ambulatory Visit (INDEPENDENT_AMBULATORY_CARE_PROVIDER_SITE_OTHER): Payer: No Typology Code available for payment source | Admitting: Internal Medicine

## 2021-11-29 ENCOUNTER — Encounter: Payer: Self-pay | Admitting: Internal Medicine

## 2021-11-29 DIAGNOSIS — R0609 Other forms of dyspnea: Secondary | ICD-10-CM | POA: Diagnosis not present

## 2021-11-29 DIAGNOSIS — R058 Other specified cough: Secondary | ICD-10-CM

## 2021-11-29 MED ORDER — PANTOPRAZOLE SODIUM 40 MG PO TBEC: 40.0000 mg | DELAYED_RELEASE_TABLET | Freq: Every day | ORAL | 2 refills | Status: DC

## 2021-11-29 MED ORDER — FAMOTIDINE 20 MG PO TABS: ORAL_TABLET | ORAL | 11 refills | Status: DC

## 2021-11-29 NOTE — Patient Instructions (Addendum)
Pantoprazole (protonix) 40 mg  Take  30-60 min before first meal of the day and Pepcid (famotidine)  20 mg after supper until return to office - this is the best way to tell whether stomach acid is contributing to your problem.    GERD (REFLUX)  is an extremely common cause of respiratory symptoms just like yours , many times with no obvious heartburn at all.    It can be treated with medication, but also with lifestyle changes including elevation of the head of your bed (ideally with 6 -8inch blocks under the headboard of your bed),  Smoking cessation, avoidance of late meals, excessive alcohol, and avoid fatty foods, chocolate, peppermint, colas, red wine, and acidic juices such as orange juice.  NO MINT OR MENTHOL PRODUCTS SO NO COUGH DROPS  USE SUGARLESS CANDY INSTEAD (Jolley ranchers or Stover's or Life Savers) or even ice chips will also do - the key is to swallow to prevent all throat clearing. NO OIL BASED VITAMINS - use powdered substitutes.  Avoid fish oil when coughing.  To get the most out of exercise, you need to be continuously aware that you are short of breath, but never out of breath, for at least 30 minutes daily. As you improve, it will actually be easier for you to do the same amount of exercise  in  30 minutes so always push to the level where you are short of breath.  Once you can do this, push for longer duration or repeat it after at least 4 hours of rest.  Check your oxygen saturations at highest level of activity and call me if losing ground and I will arrange CPST in [place] (fancy stress test on both your heart and lungs)    Please schedule a follow up visit in 3 months but call sooner if needed

## 2021-11-29 NOTE — Assessment & Plan Note (Addendum)
Onset 2000 prior to  dx of sarcoid but ? Related as not treated for cough or with systemic steroids at that time  - rx for gerd with diet  10/18/2021 >>> no better 11/29/2021  - max ppi/h2  And advised to self monitor for hoarseness assoc with spells of sob to check for vcd  11/29/2021 >>>    Upper airway cough syndrome (previously labeled PNDS),  is so named because it's frequently impossible to sort out how much is  CR/sinusitis with freq throat clearing (which can be related to primary GERD)   vs  causing  secondary (" extra esophageal")  GERD from wide swings in gastric pressure that occur with throat clearing, often  promoting self use of mint and menthol lozenges that reduce the lower esophageal sphincter tone and exacerbate the problem further in a cyclical fashion.   These are the same pts (now being labeled as having "irritable larynx syndrome" by some cough centers) who not infrequently have a history of having failed to tolerate ace inhibitors,  dry powder inhalers or biphosphonates or report having atypical/extraesophageal reflux symptoms that don't respond to standard doses of PPI  and are easily confused as having aecopd or asthma flares by even experienced allergists/ pulmonologists (myself included).   His extremely brief periods of sob (? Assoc with hoarsness) and cough since the 1990s are typical of uacs and may reflect vcd > advised to look for assoc hoarsness and if so consider ENT/ speech therapy consult co-ordinated thru the [place]  Each maintenance medication was reviewed in detail including emphasizing most importantly the difference between maintenance and prns and under what circumstances the prns are to be triggered using an action plan format where appropriate.  Total time for H and P, chart review, counseling,  directly observing portions of ambulatory 02 saturation study/ and generating customized AVS unique to this office visit / same day charting > 30 min for multiple   refractory respiratory  symptoms of uncertain etiology

## 2021-11-29 NOTE — Assessment & Plan Note (Signed)
Onset around summer of 2023 / progressive  - PFT's  03/03/21   FEV1 2.45  (91 % ) ratio 0.76  And no % improvement from saba p nothing  prior to study with DLCO  24.54 (81%)  and FV curve nl   - 09/01/2021   Walked on 3  x  3  lap(s) =  approx 750  ft  @  nl pace, stopped due to end of study, min sob s cp  with lowest 02 sats 95%  - ACE 09/01/21   = 34 / esr 6  - ACE 10/18/2021 = 47  - Allergy screen 10/18/2021 >  Eos 0.1 /  IgE  47 - 11/29/2021   Walked on RA   x  3  lap(s) =  approx 450  ft  @ fast pace, stopped due to end of study  with lowest 02 sats 95 % s sob    [facility] so likely this is conditioning related  rec reg sub max ex/ gerd rx for uacs and f/u 3 m with cpst in meantime if needed  

## 2021-11-29 NOTE — Progress Notes (Signed)
Douglas Hardy, male    DOB: 1950-02-15   MRN: [id number]   Brief patient profile:  71 yobm from [place]  quit smoking 1975  referred to pulmonary clinic 09/01/2021 by Thayer Dallas  for sob in setting of sarcoid dx in 2000  [place] by mediastinisopy no symptoms at all and rash resolved       History of Present Illness  09/01/2021  Pulmonary/ 1st office eval/ doe new summer 2022  Chief Complaint  Patient presents with   Consult    Sarcoidosis since 2000, had skin lesions.  No respiratory symptoms until 1 year ago.  Now sob all the time.  Sob mostly with exertion.  Dyspnea:  onset was insidious and progressive was 5 miles a day s stopping but then room to room  Cough: for years even before 2000 but mostly in am before brush teeth, year round, resolves with clearing throat takes up to min s sinus component Sleep: able to lie flat one pillow  SABA use: none  Rec Sub max ex     10/18/2021  f/u ov/[place] office/ re: ? Sarcoid  maint on no rx  - cough x 1990 and sob x summer 2022 Chief Complaint  Patient presents with   Follow-up    Feels breathing is a little worse    Dyspnea:  600 ft driveway uphill to mailbox and stop  Cough: ever day since sarcoid dry daytime cough  Sleeping: lie flat one pllow cpap  SABA use: none  02: none  Covid status: vax x 5  Rec To get the most out of exercise, you need to be continuously aware that you are short of breath, but never out of breath, for at least 30 minutes daily  Make sure you check your oxygen saturations at highest level of activity  Pantoprazole (protonix) 40 mg   Take  30-60 min before first meal of the day and Pepcid (famotidine)  20 mg after supper until return to office    Labs: wbc slt pancytopenia    11/29/2021  f/u ov/ re:  doe/ uacs maint on no rx (did not get rx for GERD)  Chief Complaint  Patient presents with   Follow-up    Breathing about the same since last ov    Dyspnea:  most days walks to the mb and  back brings on sob and getting worse  and variably across the room and lasts 2 sec and goes away sometimes walking back from mb  Cough: sporadic coughing during the day  Sleeping: does not disturb day  SABA use: none  02: none      No obvious day to day or daytime variability or assoc excess/ purulent sputum or mucus plugs or hemoptysis or cp or chest tightness, subjective wheeze or overt sinus or hb symptoms.   Sleeping  without nocturnal  or early am exacerbation  of respiratory  c/o's or need for noct saba. Also denies any obvious fluctuation of symptoms with weather or environmental changes or other aggravating or alleviating factors except as outlined above   No unusual exposure hx or h/o childhood pna/ asthma or knowledge of premature birth.  Current Allergies, Complete Past Medical History, Past Surgical History, Family History, and Social History were reviewed in [employer] record.  ROS  The following are not active complaints unless bolded Hoarseness, sore throat, dysphagia, dental problems, itching, sneezing,  nasal congestion or discharge of excess mucus or purulent secretions, ear ache,   fever, chills,  sweats, unintended wt loss or wt gain, classically pleuritic or exertional cp,  orthopnea pnd or arm/hand swelling  or leg swelling, presyncope, palpitations, abdominal pain, anorexia, nausea, vomiting, diarrhea  or change in bowel habits or change in bladder habits, change in stools or change in urine, dysuria, hematuria,  rash, arthralgias, visual complaints, headache, numbness, weakness or ataxia or problems with walking or coordination,  change in mood or  memory.        Current Meds  Medication Sig   acetaminophen (TYLENOL) 500 MG tablet Take 500 mg by mouth 3 (three) times daily.   diclofenac sodium (VOLTAREN) 1 % GEL Apply 1 application topically 3 (three) times daily as needed (pain).   doxazosin (CARDURA) 4 MG tablet Take 2 mg by mouth at bedtime.    gabapentin (NEURONTIN) 600 MG tablet Take 1,200 mg by mouth 3 (three) times daily.   HYDROcodone-acetaminophen (NORCO/VICODIN) 5-325 MG tablet 1 tablet as needed   levothyroxine (SYNTHROID) 125 MCG tablet Take 125 mcg by mouth daily before breakfast.   losartan (COZAAR) 50 MG tablet Take 50 mg by mouth daily.   Multiple Vitamins-Minerals (SUPER THERA VITE M) TABS Take by mouth.   sertraline (ZOLOFT) 50 MG tablet Take 1 tablet by mouth daily.   simvastatin (ZOCOR) 10 MG tablet TAKE ONE TABLET BY MOUTH AT BEDTIME FOR CHOLESTEROL   traZODone (DESYREL) 50 MG tablet Take 50 mg by mouth at bedtime.   vitamin B-12 (CYANOCOBALAMIN) 500 MCG tablet Take 500 mcg by mouth daily.               Past Medical History:  Diagnosis Date   Arthritis    Chronic back pain    Hypertension    Sleep apnea    uses CPAP       Objective:      11/29/2021      184   10/18/21 182 lb 9.6 oz (82.8 kg)  09/01/21 175 lb 9.6 oz (79.7 kg)  10/29/19 171 lb (77.6 kg)    Vital signs reviewed  11/29/2021  - Note at rest 02 sats  96% on RA   General appearance:    somber bm nad    HEENT : Oropharynx  ok     Nasal turbinates nl    NECK :  without  apparent JVD/ palpable Nodes/TM    LUNGS: no acc muscle use,  Nl contour chest which is clear to A and P bilaterally without cough on insp or exp maneuvers   CV:  RRR  no s3 or murmur or increase in P2, and no edema   ABD:  soft and nontender with nl inspiratory excursion in the supine position. No bruits or organomegaly appreciated   MS:  Nl gait/ ext warm without deformities Or obvious joint restrictions  calf tenderness, cyanosis or clubbing    SKIN: warm and dry without lesions    NEURO:  alert, approp, nl sensorium with  no motor or cerebellar deficits apparent.                          Assessment

## 2022-02-08 ENCOUNTER — Telehealth: Payer: Self-pay | Admitting: *Deleted

## 2022-02-08 NOTE — Telephone Encounter (Signed)
Called and spoke to patient, he states since taking protonix and pepcid that he has had a swollen knee and achy joints. He did not start any other new medications around the time that his symptoms started and he states that the medications have not helped with his cough or SOB. Verified he is taking as ordered. Patient wants to know if he can stop taking protonix and pepcid. Has upcoming ov on 03/07/22

## 2022-02-08 NOTE — Telephone Encounter (Signed)
ATC patient. Left detailed vm asking patient to call back if he was interested in sooner appt. Also left recs per Dr. Melvyn Novas on Vm. Nothing further needed

## 2022-02-08 NOTE — Telephone Encounter (Signed)
Very unusual side effects but for sure stop the protonix and pepcid for now and move up to next available with all meds in hand

## 2022-03-06 NOTE — Progress Notes (Unsigned)
Douglas Hardy, male    DOB: [birth year]   MRN: [id number]   Brief patient profile:  71 yobm from [place]  quit smoking 1975  referred to pulmonary clinic 09/01/2021 by Thayer [place]  for worse since summer 2022  in setting of sarcoid dx in 2000  [place] by mediastinisopy no symptoms at all and rash resolved       History of Present Illness  09/01/2021  Pulmonary/ 1st office eval/ doe new summer 2022  Chief Complaint  Patient presents with   Consult    Sarcoidosis since 2000, had skin lesions.  No respiratory symptoms until 1 year ago.  Now sob all the time.  Sob mostly with exertion.  Dyspnea:  onset was insidious and progressive was 5 miles a day s stopping but then room to room  Cough: for years even before 2000 but mostly in am before brush teeth, year round, resolves with clearing throat takes up to min s sinus component Sleep: able to lie flat one pillow  SABA use: none  Rec Sub max ex     10/18/2021  f/u ov/[place] office/ re: ? Sarcoid  maint on no rx  - cough x 1990 and sob x summer 2022 Chief Complaint  Patient presents with   Follow-up    Feels breathing is a little worse    Dyspnea:  600 ft driveway uphill to mailbox and stop  Cough: ever day since sarcoid dry daytime cough  Sleeping: lie flat one pllow cpap  SABA use: none  02: none  Covid status: vax x 5  Rec To get the most out of exercise, you need to be continuously aware that you are short of breath, but never out of breath, for at least 30 minutes daily  Make sure you check your oxygen saturations at highest level of activity  Pantoprazole (protonix) 40 mg   Take  30-60 min before first meal of the day and Pepcid (famotidine)  20 mg after supper until return to office    Labs: wbc slt pancytopenia    11/29/2021  f/u ov/ re:  doe/ uacs maint on no rx (did not get rx for GERD)  Chief Complaint  Patient presents with   Follow-up    Breathing about the same since last ov   Dyspnea:  most days  walks to the mb and back brings on sob and getting worse  and variably across the room and lasts 2 sec and goes away sometimes walking back from mb  Cough: sporadic coughing during the day  Sleeping: does not disturb day  SABA use: none  02: none  Rec Pantoprazole (protonix) 40 mg  Take  30-60 min before first meal of the day and Pepcid (famotidine)  20 mg after supper until return to office   GERD diet reviewed, bed blocks rec  To get the most out of exercise, you need to be continuously aware that you are short of breath, but never out of breath, for at least 30 minutes daily.  Check your oxygen saturations at highest level of activity and call me if losing ground and I will arrange CPST in [place] (fancy stress test on both your heart and lungs)      03/07/2022  f/u ov/[place] office/ re: doe/cough  maint on no gerd rx ? Side effects = aches in joints, resolved when stopped them  Chief Complaint  Patient presents with   Follow-up    Cough has improved    Dyspnea:  up  to 30 min of walking daily variable doe assoc with balance issues - happens at least twice daily while walking but not at the same level of exertion then rests for about minute leaning on Cane / had treadmill at [place] prior to onset of this problem as was told his heart was fine  Cough: just in am's , a min or two non prod Sleeping: flat bed/ cpap/ does fine  SABA use: none  02: none Also has spells maybe once a day when sitting the same that  on with the exertional spells    No obvious day to day or daytime variability or assoc excess/ purulent sputum or mucus plugs or hemoptysis or cp or chest tightness, subjective wheeze or overt sinus or hb symptoms.   Sleeping  without nocturnal  or early am exacerbation  of respiratory  c/o's or need for noct saba. Also denies any obvious fluctuation of symptoms with weather or environmental changes or other aggravating or alleviating factors except as outlined above   No  unusual exposure hx or h/o childhood pna/ asthma or knowledge of premature birth.  Current Allergies, Complete Past Medical History, Past Surgical History, Family History, and Social History were reviewed in [employer] record.  ROS  The following are not active complaints unless bolded Hoarseness, sore throat, dysphagia, dental problems, itching, sneezing,  nasal congestion or discharge of excess mucus or purulent secretions, ear ache,   fever, chills, sweats, unintended wt loss or wt gain, classically pleuritic or exertional cp,  orthopnea pnd or arm/hand swelling  or leg swelling, ?presyncope, palpitations, abdominal pain, anorexia, nausea, vomiting, diarrhea  or change in bowel habits or change in bladder habits, change in stools or change in urine, dysuria, hematuria,  rash, arthralgias, visual complaints, headache, numbness, weakness or ataxia or problems with walking or coordination,  change in mood or  memory.        Current Meds  Medication Sig   acetaminophen (TYLENOL) 500 MG tablet Take 500 mg by mouth 3 (three) times daily.   diclofenac sodium (VOLTAREN) 1 % GEL Apply 1 application topically 3 (three) times daily as needed (pain).   doxazosin (CARDURA) 4 MG tablet Take 2 mg by mouth at bedtime.   gabapentin (NEURONTIN) 600 MG tablet Take 1,200 mg by mouth 3 (three) times daily.   HYDROcodone-acetaminophen (NORCO/VICODIN) 5-325 MG tablet 1 tablet as needed   levothyroxine (SYNTHROID) 125 MCG tablet Take 125 mcg by mouth daily before breakfast.   losartan (COZAAR) 50 MG tablet Take 50 mg by mouth daily.   Multiple Vitamins-Minerals (SUPER THERA VITE M) TABS Take by mouth.   sertraline (ZOLOFT) 50 MG tablet Take 1 tablet by mouth daily.   simvastatin (ZOCOR) 10 MG tablet TAKE ONE TABLET BY MOUTH AT BEDTIME FOR CHOLESTEROL   traZODone (DESYREL) 50 MG tablet Take 50 mg by mouth at bedtime.   vitamin B-12 (CYANOCOBALAMIN) 500 MCG tablet Take 500 mcg by mouth daily.                 Past Medical History:  Diagnosis Date   Arthritis    Chronic back pain    Hypertension    Sleep apnea    uses CPAP       Objective:    wts  03/07/2022        187  11/29/2021      184   10/18/21 182 lb 9.6 oz (82.8 kg)  09/01/21 175 lb 9.6 oz (79.7 kg)  10/29/19 171 lb (77.6 kg)    Vital signs reviewed  03/07/2022  - Note at rest 02 sats  96% on RA   General appearance:    somber amb bm nad    HEENT : Oropharynx  clear    NECK :  without  apparent JVD/ palpable Nodes/TM    LUNGS: no acc muscle use,  Nl contour chest which is clear to A and P bilaterally without cough on insp or exp maneuvers   CV:  RRR  no s3 or murmur or increase in P2, and no edema   ABD:  soft and nontender with nl inspiratory excursion in the supine position. No bruits or organomegaly appreciated   MS:  Nl gait/ ext warm without deformities Or obvious joint restrictions  calf tenderness, cyanosis or clubbing    SKIN: warm and dry without lesions    NEURO:  alert, approp, nl sensorium with  no motor or cerebellar deficits apparent.          Assessment

## 2022-03-07 ENCOUNTER — Encounter: Payer: Self-pay | Admitting: Internal Medicine

## 2022-03-07 ENCOUNTER — Ambulatory Visit (INDEPENDENT_AMBULATORY_CARE_PROVIDER_SITE_OTHER): Payer: Medicare Other | Admitting: Internal Medicine

## 2022-03-07 VITALS — BP 132/78 | HR 78 | Temp 98.2°F | Ht 67.0 in | Wt 187.8 lb

## 2022-03-07 DIAGNOSIS — R058 Other specified cough: Secondary | ICD-10-CM

## 2022-03-07 DIAGNOSIS — R0609 Other forms of dyspnea: Secondary | ICD-10-CM | POA: Diagnosis not present

## 2022-03-07 NOTE — Patient Instructions (Signed)
We need to schedule a CPST next available in [place]   Please schedule a follow up visit in 3 months but call sooner if needed

## 2022-03-08 NOTE — Assessment & Plan Note (Signed)
Onset around summer of 2023 / progressive  - PFT's  03/03/21   FEV1 2.45  (91 % ) ratio 0.76  And no % improvement from saba p nothing  prior to study with DLCO  24.54 (81%)  and FV curve nl   - 09/01/2021   Walked on 3  x  3  lap(s) =  approx 750  ft  @  nl pace, stopped due to end of study, min sob s cp  with lowest 02 sats 95%  - ACE 09/01/21   = 34 / esr 6  - ACE 10/18/2021 = 47  - Allergy screen 10/18/2021 >  Eos 0.1 /  IgE  47 - 11/29/2021   Walked on RA   x  3  lap(s) =  approx 450  ft  @ fast pace, stopped due to end of study  with lowest 02 sats 95 % s sob   Symptoms are  disproportionate to objective findings and not clear to what extent this is actually a pulmonary  problem but pt does appear to have difficult to sort out respiratory symptoms of unknown origin for which  DDX  = almost all start with A and  include Adherence, Ace Inhibitors, Acid Reflux, Active Sinus Disease, Alpha 1 Antitripsin deficiency, Anxiety masquerading as Airways dz,  ABPA,  Allergy(esp in young), Aspiration (esp in elderly), Adverse effects of meds,  Active smoking or Vaping, A bunch of PE's/clot burden (a few small clots can't cause this syndrome unless there is already severe underlying pulm or vascular dz with poor reserve),  Anemia or thyroid disorder, plus two Bs  = Bronchiectasis and Beta blocker use..and one C= CHF    As no clear explanation and having explored all the above possibilities > CPST next step

## 2022-03-08 NOTE — Assessment & Plan Note (Signed)
Onset 2000 prior to  dx of sarcoid but ? Related as not treated for cough or with systemic steroids at that time  - rx for gerd with diet  10/18/2021 >>> no better 11/29/2021  - max ppi/h2  And advised to self monitor for hoarseness assoc with spells of sob to check for vcd  11/29/2021 >>>  resolved on gerd rx to his satisifaction but no better doe so d/c'd p developed arthralgias in Dec 2023 and cough did not flare   Suspect possible neurogenic cough or vcd here but no further w/u for now/ consider ENT eval if recurs         Each maintenance medication was reviewed in detail including emphasizing most importantly the difference between maintenance and prns and under what circumstances the prns are to be triggered using an action plan format where appropriate.  Total time for H and P, chart review, counseling,  and generating customized AVS unique to this office visit / same day charting = 20 min

## 2022-05-10 ENCOUNTER — Ambulatory Visit (HOSPITAL_COMMUNITY): Payer: Medicare Other | Attending: Cardiology

## 2022-05-10 DIAGNOSIS — R0609 Other forms of dyspnea: Secondary | ICD-10-CM | POA: Diagnosis not present

## 2022-07-29 ENCOUNTER — Ambulatory Visit
Admission: EM | Admit: 2022-07-29 | Discharge: 2022-07-29 | Disposition: A | Discharge status: Home / Self Care | Payer: No Typology Code available for payment source | Attending: Nurse Practitioner | Admitting: Nurse Practitioner

## 2022-07-29 DIAGNOSIS — M5441 Lumbago with sciatica, right side: Secondary | ICD-10-CM | POA: Diagnosis not present

## 2022-07-29 DIAGNOSIS — G8929 Other chronic pain: Secondary | ICD-10-CM | POA: Diagnosis not present

## 2022-07-29 MED ORDER — TIZANIDINE HCL 2 MG PO CAPS: 2.0000 mg | ORAL_CAPSULE | Freq: Three times a day (TID) | ORAL | 0 refills | Status: DC | PRN

## 2022-07-29 MED ORDER — DEXAMETHASONE SODIUM PHOSPHATE 10 MG/ML IJ SOLN
10.0000 mg | INTRAMUSCULAR | Status: AC
  Administered 2022-07-29: 10 mg via INTRAMUSCULAR

## 2022-07-29 NOTE — ED Provider Notes (Signed)
[facility]    CSN: [id number] Arrival date & time: 07/29/22  1017      History   Chief Complaint No chief complaint on file.   HPI Douglas Hardy is a 72 y.o. male.   The history is provided by the patient.   The patient presents for complaints of bilateral low back pain with worsening for the past 2 days.  Patient with a history of chronic low back pain.  Patient states pain feels like there is something "pulling" in his right lower back.  Patient states that he also has numbness that goes down his right lower leg.  He states that he takes hydrocodone for his back pain which initially helped.  He also states that he used his lidocaine patch, but that only helped for a short time.  Patient denies new injury or trauma, loss of bowel or bladder function, urinary symptoms, or lower extremity weakness.  He states that walking increases his back pain.  Patient is ambulating with the use of a cane, he is also wearing a back brace for his lower back.  Past Medical History:  Diagnosis Date   Arthritis    Chronic back pain    Hypertension    Sleep apnea    uses CPAP    Patient Active Problem List   Diagnosis Date Noted   Upper airway cough syndrome 10/20/2021   DOE (dyspnea on exertion) 09/01/2021   Degeneration of lumbar intervertebral disc 08/26/2019   Lumbosacral spondylosis without myelopathy 08/26/2019   Thoracic spondylosis without myelopathy 08/26/2019   Chronic pain syndrome 08/26/2019   S/P reverse total shoulder arthroplasty, right 10/25/2018   Rotator cuff tear arthropathy 09/26/2018   DDD (degenerative disc disease), lumbosacral 07/20/2017   Sacroiliac joint dysfunction of both sides 07/20/2017    Past Surgical History:  Procedure Laterality Date   FOOT SURGERY     HAND SURGERY     cyst removed-right hand and elbow    left hand surgery     cyst removed from finger   REVERSE SHOULDER ARTHROPLASTY Right 10/25/2018   Procedure: REVERSE SHOULDER  ARTHROPLASTY;  Surgeon: Francena Hanly, MD;  Location: [facility];  Service: Orthopedics;  Laterality: Right;    SHOULDER SURGERY     TOTAL THYROIDECTOMY         Home Medications    Prior to Admission medications   Medication Sig Start Date End Date Taking? Authorizing Provider  tizanidine (ZANAFLEX) 2 MG capsule Take 1 capsule (2 mg total) by mouth 3 (three) times daily as needed for muscle spasms. 07/29/22  Yes -Warren, Sadie Haber, NP  acetaminophen (TYLENOL) 500 MG tablet Take 500 mg by mouth 3 (three) times daily.    [provider]  diclofenac sodium (VOLTAREN) 1 % GEL Apply 1 application topically 3 (three) times daily as needed (pain).    [provider]  doxazosin (CARDURA) 4 MG tablet Take 2 mg by mouth at bedtime.    [provider]  famotidine (PEPCID) 20 MG tablet One after supper Patient not taking: Reported on 03/07/2022 11/29/21   Nyoka Cowden, MD  gabapentin (NEURONTIN) 600 MG tablet Take 1,200 mg by mouth 3 (three) times daily.    [provider]  HYDROcodone-acetaminophen (NORCO/VICODIN) 5-325 MG tablet 1 tablet as needed    [provider]  levothyroxine (SYNTHROID) 125 MCG tablet Take 125 mcg by mouth daily before breakfast.    [provider]  losartan (COZAAR) 50 MG tablet Take 50 mg  by mouth daily.    [provider]  Multiple Vitamins-Minerals (SUPER THERA VITE M) TABS Take by mouth.    [provider]  pantoprazole (PROTONIX) 40 MG tablet Take 1 tablet (40 mg total) by mouth daily. Take 30-60 min before first meal of the day Patient not taking: Reported on 03/07/2022 11/29/21   Nyoka Cowden, MD  sertraline (ZOLOFT) 50 MG tablet Take 1 tablet by mouth daily. 12/03/19   [provider]  simvastatin (ZOCOR) 10 MG tablet TAKE ONE TABLET BY MOUTH AT BEDTIME FOR CHOLESTEROL 03/02/21   [provider]  traZODone (DESYREL) 50 MG tablet Take 50 mg by mouth at bedtime.     [provider]  vitamin B-12 (CYANOCOBALAMIN) 500 MCG tablet Take 500 mcg by mouth daily.    [provider]    Family History Family History  Problem Relation Age of Onset   Hyperlipidemia Mother    Hyperlipidemia Father    Hyperlipidemia Sister    Cancer Brother    Stroke Brother    Hyperlipidemia Brother     Social History Social History   Tobacco Use   Smoking status: Former    Packs/day: .5    Types: Cigarettes    Quit date: 09/07/1973    Years since quitting: 48.9   Smokeless tobacco: Never  Vaping Use   Vaping Use: Never used  Substance Use Topics   Alcohol use: Yes    Alcohol/week: 1.0 standard drink of alcohol    Types: 1 Glasses of wine per week    Comment: Occ   Drug use: No     Allergies   Lisinopril and Celebrex [celecoxib]   Review of Systems Review of Systems Per HPI  Physical Exam Triage Vital Signs ED Triage Vitals [07/29/22 1128]  Enc Vitals Group     BP      Pulse      Resp      Temp      Temp src      SpO2      Weight      Height      Head Circumference      Peak Flow      Pain Score 10     Pain Loc      Pain Edu?      Excl. in GC?    No data found.  Updated Vital Signs BP (!) 212/112 (BP Location: Right Arm)   Pulse 84   Temp (!) 97.5 F (36.4 C) (Oral)   Resp 16   SpO2 96%   Visual Acuity Right Eye Distance:   Left Eye Distance:   Bilateral Distance:    Right Eye Near:   Left Eye Near:    Bilateral Near:     Physical Exam Vitals and nursing note reviewed.  Constitutional:      General: He is not in acute distress.    Appearance: Normal appearance.  HENT:     Head: Normocephalic.  Eyes:     Extraocular Movements: Extraocular movements intact.     Pupils: Pupils are equal, round, and reactive to light.  Cardiovascular:     Rate and Rhythm: Normal rate and regular rhythm.     Pulses: Normal pulses.     Heart sounds: Normal heart sounds.  Pulmonary:     Effort: Pulmonary effort is  normal.     Breath sounds: Normal breath sounds.  Abdominal:     General: Bowel sounds are normal.  Palpations: Abdomen is soft.     Tenderness: There is no abdominal tenderness.  Musculoskeletal:     Cervical back: Normal range of motion.     Lumbar back: Tenderness (right paraspinal region between L2 through L5) present. No swelling, edema, deformity or signs of trauma. Decreased range of motion.  Lymphadenopathy:     Cervical: No cervical adenopathy.  Skin:    General: Skin is warm and dry.  Neurological:     General: No focal deficit present.     Mental Status: He is alert and oriented to person, place, and time.  Psychiatric:        Mood and Affect: Mood normal.        Behavior: Behavior normal.      UC Treatments / Results  Labs (all labs ordered are listed, but only abnormal results are displayed) Labs Reviewed - No data to display  EKG   Radiology No results found.  Procedures Procedures (including critical care time)  Medications Ordered in UC Medications  dexamethasone (DECADRON) injection 10 mg (has no administration in time range)    Initial Impression / Assessment and Plan / UC Course  I have reviewed the triage vital signs and the nursing notes.  Pertinent labs & imaging results that were available during my care of the patient were reviewed by me and considered in my medical decision making (see chart for details).  Patient presents for complaints of bilateral low back pain, with tenderness to the right lower back.  Patient is moderately hypertensive, he denies chest pain, shortness of breath, headache, dizziness, blurred vision, or lightheadedness, patient states his blood pressure is elevated due to his back pain.    Patient with history of chronic low back pain.  Shared decision making to defer imaging with patient as this will not change the course of treatment today.  Patient was given Decadron 10 mg IM for possible inflammation.  Tizanidine 2  mg was prescribed for spasm and musculoskeletal pain.  Supportive care recommendations were provided and discussed with the patient to include continuing his current pain management, the use of ice or heat, and strict ER follow-up precaution.  [facility].  Patient was also advised to take his blood pressure medication when he arrives home.  He was also advised to continue to monitor his blood pressure.  Patient advised to follow-up in the emergency department immediately if he develops chest pain, shortness of breath, difficulty breathing, headache, dizziness, or other concerns.  Patient was in agreement with this plan of care and verbalized understanding.  All questions were answered.  Patient is stable for discharge.  Final Clinical Impressions(s) / UC Diagnoses   Final diagnoses:  Chronic bilateral low back pain with right-sided sciatica     Discharge Instructions      You are given an injection of Decadron 10 mg.  This is a steroid. Take medication as prescribed. Continue current pain medication as prescribed. Recommend the use of ice or heat as needed.  Apply ice for pain or swelling, heat for spasm or stiffness.  Apply for 20 minutes, remove for 1 hour, then repeat as needed. Go to the emergency department immediately if you are experiencing worsening back pain, become unable to walk, experience worsening weakness in your lower extremities, lose control of your bowel or bladder, or other concerns. Recommend scheduling an appointment at the [facility] with your orthopedist or primary care physician for further  evaluation of your back pain.  Please let them know that you are experiencing new pain symptoms to discuss a possible MRI. Follow-up as needed.     ED Prescriptions     Medication Sig Dispense Auth. Provider   tizanidine (ZANAFLEX) 2 MG capsule Take 1 capsule (2 mg total) by mouth 3 (three) times daily as  needed for muscle spasms. 20 capsule -Warren, Sadie Haber, NP      PDMP not reviewed this encounter.   Abran Cantor, NP 07/29/22 1319

## 2022-07-29 NOTE — ED Triage Notes (Signed)
Pt c/o lower middle back pain x 2 days, has gotten worse of the course of the week. Pt  states he was unable to get out of the bed this morning.

## 2022-07-29 NOTE — Discharge Instructions (Addendum)
You are given an injection of Decadron 10 mg.  This is a steroid. Take medication as prescribed. Continue current pain medication as prescribed. Recommend the use of ice or heat as needed.  Apply ice for pain or swelling, heat for spasm or stiffness.  Apply for 20 minutes, remove for 1 hour, then repeat as needed. Go to the emergency department immediately if you are experiencing worsening back pain, become unable to walk, experience worsening weakness in your lower extremities, lose control of your bowel or bladder, or other concerns. Recommend scheduling an appointment at the [facility] with your orthopedist or primary care physician for further evaluation of your back pain.  Please let them know that you are experiencing new pain symptoms to discuss a possible MRI. Follow-up as needed.

## 2022-09-11 NOTE — Progress Notes (Deleted)
Douglas Hardy, male    DOB: [birth year]   MRN: [id number]   Brief patient profile:  72   yobm  quit smoking 1975  referred to pulmonary clinic 09/01/2021 by Lenn Sink  for worse doe since summer 2022  in setting of sarcoid dx in 2000 in  [place] by mediastinisopy no symptoms at all and rash resolved       History of Present Illness  09/01/2021  Pulmonary/ 1st office eval/[place] Dirosa doe new summer 2022  Chief Complaint  Patient presents with   Consult    Sarcoidosis since 2000, had skin lesions.  No respiratory symptoms until 1 year ago.  Now sob all the time.  Sob mostly with exertion.  Dyspnea:  onset was insidious and progressive was 5 miles a day s stopping but then room to room  Cough: for years even before 2000 but mostly in am before brush teeth, year round, resolves with clearing throat takes up to min s sinus component Sleep: able to lie flat one pillow  SABA use: none  Rec Sub max ex     10/18/2021  f/u ov/[place] office/Tyerra Loretto re: ? Sarcoid  maint on no rx  - cough x 1990 and sob x summer 2022 Chief Complaint  Patient presents with   Follow-up    Feels breathing is a little worse    Dyspnea:  600 ft driveway uphill to mailbox and stop  Cough: ever day since sarcoid dry daytime cough  Sleeping: lie flat one pllow cpap  SABA use: none  02: none  Covid status: vax x 5  Rec To get the most out of exercise, you need to be continuously aware that you are short of breath, but never out of breath, for at least 30 minutes daily  Make sure you check your oxygen saturations at highest level of activity  Pantoprazole (protonix) 40 mg   Take  30-60 min before first meal of the day and Pepcid (famotidine)  20 mg after supper until return to office    Labs: wbc slt pancytopenia    11/29/2021  f/u ov/Siham Bucaro re:  doe/ uacs maint on no rx (did not get rx for GERD)  Chief Complaint  Patient presents with   Follow-up    Breathing about the same since last ov   Dyspnea:  most days walks  to the mb and back brings on sob and getting worse  and variably across the room and lasts 2 sec and goes away sometimes walking back from mb  Cough: sporadic coughing during the day  Sleeping: does not disturb day  SABA use: none  02: none  Rec Pantoprazole (protonix) 40 mg  Take  30-60 min before first meal of the day and Pepcid (famotidine)  20 mg after supper until return to office   GERD diet reviewed, bed blocks rec  To get the most out of exercise, you need to be continuously aware that you are short of breath, but never out of breath, for at least 30 minutes daily.  Check your oxygen saturations at highest level of activity and call me if losing ground and I will arrange CPST in [place] (fancy stress test on both your heart and lungs)      03/07/2022  f/u ov/[place] office/Odeth Bry re: doe/cough  maint on no gerd rx ? Side effects = aches in joints, resolved when stopped them  Chief Complaint  Patient presents with   Follow-up    Cough has improved    Dyspnea:  up to 30 min of walking daily variable doe assoc with balance issues - happens at least twice daily while walking but not at the same level of exertion then rests for about minute leaning on Cane / had treadmill at [place] prior to onset of this problem as was told his heart was fine  Cough: just in am's , a min or two non prod Sleeping: flat bed/ cpap/ does fine  SABA use: none  02: none Also has spells maybe once a day when sitting  Rec We need to schedule a CPST next available in [place]  Please schedule a follow up visit in 3 months but call sooner if needed   CPST  05/10/22  nl study x for mild restriction based on body habitus  09/12/2022  f/u ov/[place] office/Josefine Fuhr re: *** maint on ***  No chief complaint on file.   Dyspnea:  *** Cough: *** Sleeping: *** SABA use: *** 02: *** Covid status: *** Lung cancer screening: ***   No obvious day to day or daytime variability or assoc excess/ purulent sputum or  mucus plugs or hemoptysis or cp or chest tightness, subjective wheeze or overt sinus or hb symptoms.   *** without nocturnal  or early am exacerbation  of respiratory  c/o's or need for noct saba. Also denies any obvious fluctuation of symptoms with weather or environmental changes or other aggravating or alleviating factors except as outlined above   No unusual exposure hx or h/o childhood pna/ asthma or knowledge of premature birth.  Current Allergies, Complete Past Medical History, Past Surgical History, Family History, and Social History were reviewed in [employer] record.  ROS  The following are not active complaints unless bolded Hoarseness, sore throat, dysphagia, dental problems, itching, sneezing,  nasal congestion or discharge of excess mucus or purulent secretions, ear ache,   fever, chills, sweats, unintended wt loss or wt gain, classically pleuritic or exertional cp,  orthopnea pnd or arm/hand swelling  or leg swelling, presyncope, palpitations, abdominal pain, anorexia, nausea, vomiting, diarrhea  or change in bowel habits or change in bladder habits, change in stools or change in urine, dysuria, hematuria,  rash, arthralgias, visual complaints, headache, numbness, weakness or ataxia or problems with walking or coordination,  change in mood or  memory.        No outpatient medications have been marked as taking for the 09/12/22 encounter (Appointment) with Nyoka Cowden, MD.                  Past Medical History:  Diagnosis Date   Arthritis    Chronic back pain    Hypertension    Sleep apnea    uses CPAP       Objective:    wts  09/12/2022          ***  03/07/2022        187  11/29/2021      184   10/18/21 182 lb 9.6 oz (82.8 kg)  09/01/21 175 lb 9.6 oz (79.7 kg)  10/29/19 171 lb (77.6 kg)   Vital signs reviewed  09/12/2022  - Note at rest 02 sats  ***% on ***   General appearance:    ***       Assessment

## 2022-09-12 ENCOUNTER — Ambulatory Visit: Payer: No Typology Code available for payment source | Admitting: Internal Medicine

## 2022-09-12 DIAGNOSIS — E039 Hypothyroidism, unspecified: Secondary | ICD-10-CM | POA: Insufficient documentation

## 2022-09-12 DIAGNOSIS — J309 Allergic rhinitis, unspecified: Secondary | ICD-10-CM | POA: Insufficient documentation

## 2022-09-12 DIAGNOSIS — H2513 Age-related nuclear cataract, bilateral: Secondary | ICD-10-CM | POA: Insufficient documentation

## 2022-09-12 DIAGNOSIS — M419 Scoliosis, unspecified: Secondary | ICD-10-CM | POA: Insufficient documentation

## 2022-09-12 DIAGNOSIS — F4312 Post-traumatic stress disorder, chronic: Secondary | ICD-10-CM | POA: Insufficient documentation

## 2022-09-12 DIAGNOSIS — I10 Essential (primary) hypertension: Secondary | ICD-10-CM | POA: Insufficient documentation

## 2022-09-12 DIAGNOSIS — M546 Pain in thoracic spine: Secondary | ICD-10-CM | POA: Insufficient documentation

## 2022-09-12 DIAGNOSIS — D61818 Other pancytopenia: Secondary | ICD-10-CM | POA: Insufficient documentation

## 2022-09-12 DIAGNOSIS — M109 Gout, unspecified: Secondary | ICD-10-CM | POA: Insufficient documentation

## 2022-09-12 DIAGNOSIS — N189 Chronic kidney disease, unspecified: Secondary | ICD-10-CM | POA: Insufficient documentation

## 2022-09-12 DIAGNOSIS — R599 Enlarged lymph nodes, unspecified: Secondary | ICD-10-CM | POA: Insufficient documentation

## 2022-09-12 DIAGNOSIS — N400 Enlarged prostate without lower urinary tract symptoms: Secondary | ICD-10-CM | POA: Insufficient documentation

## 2022-09-12 DIAGNOSIS — E041 Nontoxic single thyroid nodule: Secondary | ICD-10-CM | POA: Insufficient documentation

## 2022-09-12 DIAGNOSIS — H43819 Vitreous degeneration, unspecified eye: Secondary | ICD-10-CM | POA: Insufficient documentation

## 2022-09-12 DIAGNOSIS — F32A Depression, unspecified: Secondary | ICD-10-CM | POA: Insufficient documentation

## 2022-09-12 DIAGNOSIS — M999 Biomechanical lesion, unspecified: Secondary | ICD-10-CM | POA: Insufficient documentation

## 2022-09-12 DIAGNOSIS — G4733 Obstructive sleep apnea (adult) (pediatric): Secondary | ICD-10-CM | POA: Insufficient documentation

## 2022-09-12 DIAGNOSIS — D869 Sarcoidosis, unspecified: Secondary | ICD-10-CM | POA: Insufficient documentation

## 2022-09-12 DIAGNOSIS — R351 Nocturia: Secondary | ICD-10-CM | POA: Insufficient documentation

## 2022-09-12 DIAGNOSIS — M4120 Other idiopathic scoliosis, site unspecified: Secondary | ICD-10-CM | POA: Insufficient documentation

## 2022-09-12 DIAGNOSIS — F321 Major depressive disorder, single episode, moderate: Secondary | ICD-10-CM | POA: Insufficient documentation

## 2022-09-12 DIAGNOSIS — H25093 Other age-related incipient cataract, bilateral: Secondary | ICD-10-CM | POA: Insufficient documentation

## 2022-09-14 NOTE — Progress Notes (Unsigned)
Douglas Hardy, male    DOB: 07/30/1950   MRN: [id number]   Brief patient profile:  72   yobm  quit smoking 1975  referred to pulmonary clinic 09/01/2021 by Lenn Sink  for worse doe since summer 2022  in setting of sarcoid dx in 2000 in  [place] by mediastinisopy no symptoms at all and rash resolved       History of Present Illness  09/01/2021  Pulmonary/ 1st office eval/ doe new summer 2022  Chief Complaint  Patient presents with   Consult    Sarcoidosis since 2000, had skin lesions.  No respiratory symptoms until 1 year ago.  Now sob all the time.  Sob mostly with exertion.  Dyspnea:  onset was insidious and progressive was 5 miles a day s stopping but then room to room  Cough: for years even before 2000 but mostly in am before brush teeth, year round, resolves with clearing throat takes up to min s sinus component Sleep: able to lie flat one pillow  SABA use: none  Rec Sub max ex     10/18/2021  f/u ov/[place] office/ re: ? Sarcoid  maint on no rx  - cough x 1990 and sob x summer 2022 Chief Complaint  Patient presents with   Follow-up    Feels breathing is a little worse    Dyspnea:  600 ft driveway uphill to mailbox and stop  Cough: ever day since sarcoid dry daytime cough  Sleeping: lie flat one pllow cpap  SABA use: none  02: none  Covid status: vax x 5  Rec To get the most out of exercise, you need to be continuously aware that you are short of breath, but never out of breath, for at least 30 minutes daily  Make sure you check your oxygen saturations at highest level of activity  Pantoprazole (protonix) 40 mg   Take  30-60 min before first meal of the day and Pepcid (famotidine)  20 mg after supper until return to office    Labs: wbc slt pancytopenia    11/29/2021  f/u ov/ re:  doe/ uacs maint on no rx (did not get rx for GERD)  Chief Complaint  Patient presents with   Follow-up    Breathing about the same since last ov   Dyspnea:  most days walks  to the mb and back brings on sob and getting worse  and variably across the room and lasts 2 sec and goes away sometimes walking back from mb  Cough: sporadic coughing during the day  Sleeping: does not disturb day  SABA use: none  02: none  Rec Pantoprazole (protonix) 40 mg  Take  30-60 min before first meal of the day and Pepcid (famotidine)  20 mg after supper until return to office   GERD diet reviewed, bed blocks rec  To get the most out of exercise, you need to be continuously aware that you are short of breath, but never out of breath, for at least 30 minutes daily.  Check your oxygen saturations at highest level of activity and call me if losing ground and I will arrange CPST in [place] (fancy stress test on both your heart and lungs)      03/07/2022  f/u ov/[place] office/ re: doe/cough  maint on no gerd rx ? Side effects = aches in joints, resolved when stopped them  Chief Complaint  Patient presents with   Follow-up    Cough has improved    Dyspnea:  up to 30 min of walking daily variable doe assoc with balance issues - happens at least twice daily while walking but not at the same level of exertion then rests for about minute leaning on Cane / had treadmill at [place] prior to onset of this problem as was told his heart was fine  Cough: just in am's , a min or two non prod Sleeping: flat bed/ cpap/ does fine  SABA use: none  02: none Also has spells maybe once a day when sitting  Rec We need to schedule a CPST next available in [place]  Please schedule a follow up visit in 3 months but call sooner if needed   CPST  05/10/22  nl study x for mild restriction based on body habitus  09/15/2022  f/u ov/[place] office/ re: *** maint on ***  No chief complaint on file.   Dyspnea:  *** Cough: *** Sleeping: *** SABA use: *** 02: *** Covid status: *** Lung cancer screening: ***   No obvious day to day or daytime variability or assoc excess/ purulent sputum or  mucus plugs or hemoptysis or cp or chest tightness, subjective wheeze or overt sinus or hb symptoms.   *** without nocturnal  or early am exacerbation  of respiratory  c/o's or need for noct saba. Also denies any obvious fluctuation of symptoms with weather or environmental changes or other aggravating or alleviating factors except as outlined above   No unusual exposure hx or h/o childhood pna/ asthma or knowledge of premature birth.  Current Allergies, Complete Past Medical History, Past Surgical History, Family History, and Social History were reviewed in [employer] record.  ROS  The following are not active complaints unless bolded Hoarseness, sore throat, dysphagia, dental problems, itching, sneezing,  nasal congestion or discharge of excess mucus or purulent secretions, ear ache,   fever, chills, sweats, unintended wt loss or wt gain, classically pleuritic or exertional cp,  orthopnea pnd or arm/hand swelling  or leg swelling, presyncope, palpitations, abdominal pain, anorexia, nausea, vomiting, diarrhea  or change in bowel habits or change in bladder habits, change in stools or change in urine, dysuria, hematuria,  rash, arthralgias, visual complaints, headache, numbness, weakness or ataxia or problems with walking or coordination,  change in mood or  memory.        No outpatient medications have been marked as taking for the 09/15/22 encounter (Appointment) with Nyoka Cowden, MD.                  Past Medical History:  Diagnosis Date   Arthritis    Chronic back pain    Hypertension    Sleep apnea    uses CPAP       Objective:    wts  09/15/2022          ***  03/07/2022        187  11/29/2021      184   10/18/21 182 lb 9.6 oz (82.8 kg)  09/01/21 175 lb 9.6 oz (79.7 kg)  10/29/19 171 lb (77.6 kg)   Vital signs reviewed  09/15/2022  - Note at rest 02 sats  ***% on ***   General appearance:    ***       Assessment

## 2022-09-15 ENCOUNTER — Telehealth: Payer: Self-pay | Admitting: Internal Medicine

## 2022-09-15 ENCOUNTER — Encounter: Payer: Self-pay | Admitting: Internal Medicine

## 2022-09-15 ENCOUNTER — Ambulatory Visit: Payer: No Typology Code available for payment source | Admitting: Internal Medicine

## 2022-09-15 VITALS — BP 153/81 | HR 68 | Ht 67.0 in | Wt 179.0 lb

## 2022-09-15 DIAGNOSIS — R0609 Other forms of dyspnea: Secondary | ICD-10-CM

## 2022-09-15 NOTE — Telephone Encounter (Signed)
On final chart review  I ordered  baseline cxr   last one was one year ago and needs to be updated for furture reference if needed - please have him go whenever he has time this month would be fine

## 2022-09-15 NOTE — Assessment & Plan Note (Signed)
Onset around summer of 2023 / progressive  - PFT's  03/03/21   FEV1 2.45  (91 % ) ratio 0.76  And no % improvement from saba p nothing  prior to study with DLCO  24.54 (81%)  and FV curve nl   - 09/01/2021   Walked on 3  x  3  lap(s) =  approx 750  ft  @  nl pace, stopped due to end of study, min sob s cp  with lowest 02 sats 95%  - ACE 09/01/21   = 34 / esr 6  - ACE 10/18/2021 = 47  - Allergy screen 10/18/2021 >  Eos 0.1 /  IgE  47 - 11/29/2021   Walked on RA   x  3  lap(s) =  approx 450  ft  @ fast pace, stopped due to end of study  with lowest 02 sats 95 % s sob  - CPST 05/10/22  wnl  (97% pred peak 02 uptake)/ stopped due to L hip pain spirometry mild  restrictive pattern   Rec Sub max ex up to 30 min three times per week  (recumbant bike if can't to wt bearing due to hip pain)  Pulmonary f/u is prn          Each maintenance medication was reviewed in detail including emphasizing most importantly the difference between maintenance and prns and under what circumstances the prns are to be triggered using an action plan format where appropriate.  Total time for H and P, chart review, counseling, and generating customized AVS unique to this office visit / same day charting = 30 min summary final f/u ov

## 2022-09-15 NOTE — Patient Instructions (Addendum)
To get the most out of exercise, you need to be continuously aware that you are short of breath, but never out of breath, for at least 30 minutes daily. As you improve, it will actually be easier for you to do the same amount of exercise  in  30 minutes so always push to the level where you are short of breath.    [facility]    P[facility] follow up is as needed   Add baseline cxr needed for future reference as last one was one year ago.

## 2022-09-16 NOTE — Telephone Encounter (Signed)
Spoke with patient and advised, he will go to [facility] soon. Order placed.

## 2023-03-16 ENCOUNTER — Ambulatory Visit
Admission: EM | Admit: 2023-03-16 | Discharge: 2023-03-16 | Disposition: A | Discharge status: Home / Self Care | Payer: Medicare Other | Attending: Nurse Practitioner | Admitting: Nurse Practitioner

## 2023-03-16 ENCOUNTER — Encounter: Payer: Self-pay | Admitting: Emergency Medicine

## 2023-03-16 DIAGNOSIS — R1012 Left upper quadrant pain: Secondary | ICD-10-CM

## 2023-03-16 LAB — POCT URINALYSIS DIP (MANUAL ENTRY)
Blood, UA: NEGATIVE
Glucose, UA: NEGATIVE mg/dL
Leukocytes, UA: NEGATIVE
Nitrite, UA: NEGATIVE
Protein Ur, POC: 100 mg/dL — AB
Spec Grav, UA: 1.02 (ref 1.010–1.025)
Urobilinogen, UA: 0.2 U/dL
pH, UA: 6 (ref 5.0–8.0)

## 2023-03-16 NOTE — ED Notes (Signed)
 Patient is being discharged from the Urgent Care and sent to the Emergency Department via private vehicle . Per NP, patient is in need of higher level of care due to ABD pain. Patient is aware and verbalizes understanding of plan of care.

## 2023-03-16 NOTE — ED Triage Notes (Signed)
 Left upper ABD pain x 4 days.  States pain is now radiating to left side and back.  States when he moves certain ways, he feels the pain.  Last BM was this morning and almost like diarrhea.

## 2023-03-16 NOTE — ED Provider Notes (Signed)
 [facility]    CSN: [id number] Arrival date & time: 03/16/23  1135      History   Chief Complaint No chief complaint on file.   HPI Douglas Hardy is a 73 y.o. male.   The history is provided by the patient.   Patient presents for complaints of left upper quadrant pain has been present for the past 4 days.  Patient states that the pain radiates into his left back.  States I have horrible pain in my stomach.  Describes the pain as stabbing, currently rates the pain 8/10 at present.  Patient states when he is up moving around, the pain becomes 10/10.  He states that he has also had diarrhea and urinary frequency.  He states his diarrhea has since improved.  Denies fever, chills, chest pain, nausea, vomiting, constipation, bloody stools, gas, bloating or belching.  Denies previous GI history.  Past Medical History:  Diagnosis Date   Arthritis    Chronic back pain    Hypertension    Sleep apnea    uses CPAP    Patient Active Problem List   Diagnosis Date Noted   Allergic rhinitis 09/12/2022   Benign prostatic hyperplasia 09/12/2022   Chronic kidney disease 09/12/2022   Enlarged lymph nodes 09/12/2022   Essential (primary) hypertension 09/12/2022   Gout 09/12/2022   Idiopathic scoliosis and kyphoscoliosis 09/12/2022   Moderate major depression, single episode (HCC) 09/12/2022   Nocturia 09/12/2022   Nonallopathic lesion of head region 09/12/2022   Nonallopathic lesion of cervical region 09/12/2022   Nonallopathic lesion of sacral region 09/12/2022   Obstructive sleep apnea (adult) (pediatric) 09/12/2022   Other age-related incipient cataract, bilateral 09/12/2022   Pain in thoracic spine 09/12/2022   Pancytopenia (HCC) 09/12/2022   Posterior vitreous detachment 09/12/2022   Chronic post-traumatic stress disorder 09/12/2022   Sarcoidosis 09/12/2022   Scoliosis 09/12/2022   Hypothyroidism 09/12/2022   Thyroid nodule 09/12/2022   Depression 09/12/2022    Age-related nuclear cataract, bilateral 09/12/2022   Upper airway cough syndrome 10/20/2021   DOE (dyspnea on exertion) 09/01/2021   Arthropathy of cervical facet joint 07/29/2020   Degeneration of lumbar intervertebral disc 08/26/2019   Lumbosacral spondylosis without myelopathy 08/26/2019   Thoracic spondylosis without myelopathy 08/26/2019   Chronic pain syndrome 08/26/2019   S/P reverse total shoulder arthroplasty, right 10/25/2018   Rotator cuff tear arthropathy 09/26/2018   DDD (degenerative disc disease), lumbosacral 07/20/2017   Sacroiliac joint dysfunction of both sides 07/20/2017    Past Surgical History:  Procedure Laterality Date   FOOT SURGERY     HAND SURGERY     cyst removed-right hand and elbow    left hand surgery     cyst removed from finger   REVERSE SHOULDER ARTHROPLASTY Right 10/25/2018   Procedure: REVERSE SHOULDER ARTHROPLASTY;  Surgeon: Melita Drivers, MD;  Location: [facility];  Service: Orthopedics;  Laterality: Right;    SHOULDER SURGERY     TOTAL THYROIDECTOMY         Home Medications    Prior to Admission medications   Medication Sig Start Date End Date Taking? Authorizing Provider  acetaminophen  (TYLENOL ) 500 MG tablet Take 500 mg by mouth 3 (three) times daily.    [provider]  diclofenac sodium (VOLTAREN) 1 % GEL Apply 1 application topically 3 (three) times daily as needed (pain).    [provider]  doxazosin  (CARDURA ) 4 MG tablet Take 2 mg by mouth at bedtime.    [provider]  gabapentin  (NEURONTIN ) 300 MG capsule Take 600 mg by mouth 3 (three) times daily. 07/20/17   [provider]  HYDROcodone -acetaminophen  (NORCO/VICODIN) 5-325 MG tablet 1 tablet as needed    [provider]  levothyroxine  (SYNTHROID ) 125 MCG tablet Take 125 mcg by mouth daily before breakfast.    [provider]  losartan  (COZAAR ) 100 MG tablet Take 100 mg by mouth daily. 08/30/22   [provider]   Multiple Vitamins-Minerals (SUPER THERA VITE M) TABS Take by mouth.    [provider]  sertraline (ZOLOFT) 100 MG tablet Take 150 mg by mouth at bedtime. 04/04/22 04/05/23  [provider]  simvastatin (ZOCOR) 10 MG tablet TAKE ONE TABLET BY MOUTH AT BEDTIME FOR CHOLESTEROL 03/02/21   [provider]  traZODone  (DESYREL ) 150 MG tablet Take 150 mg by mouth at bedtime. 04/07/22   [provider]  vitamin B-12 (CYANOCOBALAMIN) 500 MCG tablet Take 500 mcg by mouth daily.    [provider]    Family History Family History  Problem Relation Age of Onset   Hyperlipidemia Mother    Hyperlipidemia Father    Hyperlipidemia Sister    Cancer Brother    Stroke Brother    Hyperlipidemia Brother     Social History Social History   Tobacco Use   Smoking status: Former    Current packs/day: 0.00    Types: Cigarettes    Quit date: 09/07/1973    Years since quitting: 49.5   Smokeless tobacco: Never  Vaping Use   Vaping status: Never Used  Substance Use Topics   Alcohol use: Yes    Alcohol/week: 1.0 standard drink of alcohol    Types: 1 Glasses of wine per week    Comment: Occ   Drug use: No     Allergies   Lisinopril, Ace inhibitors, Celecoxib, Famotidine , and Pantoprazole    Review of Systems Review of Systems Per HPI  Physical Exam Triage Vital Signs ED Triage Vitals  Encounter Vitals Group     BP 03/16/23 1205 119/70     Systolic BP Percentile --      Diastolic BP Percentile --      Pulse Rate 03/16/23 1205 80     Resp 03/16/23 1205 18     Temp 03/16/23 1205 98 F (36.7 C)     Temp Source 03/16/23 1205 Oral     SpO2 03/16/23 1205 92 %     Weight --      Height --      Head Circumference --      Peak Flow --      Pain Score 03/16/23 1207 9     Pain Loc --      Pain Education --      Exclude from Growth Chart --    No data found.  Updated Vital Signs BP 119/70 (BP Location: Right Arm)   Pulse 80   Temp 98 F (36.7 C)  (Oral)   Resp 18   SpO2 92%   Visual Acuity Right Eye Distance:   Left Eye Distance:   Bilateral Distance:    Right Eye Near:   Left Eye Near:    Bilateral Near:     Physical Exam Vitals and nursing note reviewed.  Constitutional:      General: He is not in acute distress.    Appearance: Normal appearance.  HENT:     Head: Normocephalic.     Mouth/Throat:     Mouth: Mucous membranes  are moist.  Eyes:     Extraocular Movements: Extraocular movements intact.     Pupils: Pupils are equal, round, and reactive to light.  Cardiovascular:     Rate and Rhythm: Normal rate and regular rhythm.     Pulses: Normal pulses.     Heart sounds: Normal heart sounds.  Pulmonary:     Effort: Pulmonary effort is normal. No respiratory distress.     Breath sounds: Normal breath sounds. No stridor. No wheezing, rhonchi or rales.  Abdominal:     General: Bowel sounds are normal. There is no distension.     Tenderness: There is abdominal tenderness in the left upper quadrant. There is left CVA tenderness and guarding.  Musculoskeletal:     Cervical back: Normal range of motion.  Skin:    General: Skin is warm and dry.  Neurological:     General: No focal deficit present.     Mental Status: He is alert and oriented to person, place, and time.  Psychiatric:        Mood and Affect: Mood normal.        Behavior: Behavior normal.      UC Treatments / Results  Labs (all labs ordered are listed, but only abnormal results are displayed) Labs Reviewed  POCT URINALYSIS DIP (MANUAL ENTRY) - Abnormal; Notable for the following components:      Result Value   Bilirubin, UA small (*)    Ketones, POC UA trace (5) (*)    Protein Ur, POC =100 (*)    All other components within normal limits    EKG   Radiology No results found.  Procedures Procedures (including critical care time)  Medications Ordered in UC Medications - No data to display  Initial Impression / Assessment and Plan / UC  Course  I have reviewed the triage vital signs and the nursing notes.  Pertinent labs & imaging results that were available during my care of the patient were reviewed by me and considered in my medical decision making (see chart for details).  Urinalysis was negative for obvious infection. On exam, lung sounds are clear throughout.  Patient with moderate left upper quadrant abdominal tenderness on exam along with + left CVA tenderness.  Patient currently rates pain 8/10 at present.  Urinalysis does not indicate an obvious infection, patient with moderate left upper quadrant tenderness and left CVA tenderness.  Cannot rule out acute abdomen at this time.  Based on patient's presentation and current symptoms, recommend that he is seen in the emergency department for further evaluation.  Discussed recommendation with the patient with rationale, patient was in agreement with this plan of care.  Patient's vital signs are stable at this time.  He is able to travel via private vehicle.  Patient ambulatory at discharge.  Patient discharged to the emergency department.  Final Clinical Impressions(s) / UC Diagnoses   Final diagnoses:  None   Discharge Instructions   None    ED Prescriptions   None    PDMP not reviewed this encounter.   Gilmer Etta PARAS, NP 03/16/23 1244

## 2023-03-16 NOTE — Discharge Instructions (Signed)
 Go to the emergency department for further evaluation.

## 2023-10-24 ENCOUNTER — Other Ambulatory Visit (HOSPITAL_COMMUNITY): Payer: Self-pay | Admitting: Physical Medicine and Rehabilitation

## 2023-10-24 DIAGNOSIS — M47812 Spondylosis without myelopathy or radiculopathy, cervical region: Secondary | ICD-10-CM

## 2023-10-27 ENCOUNTER — Encounter (HOSPITAL_COMMUNITY): Payer: Self-pay

## 2023-10-27 ENCOUNTER — Other Ambulatory Visit (HOSPITAL_COMMUNITY)

## 2023-10-28 ENCOUNTER — Ambulatory Visit (HOSPITAL_COMMUNITY): Admission: RE | Admit: 2023-10-28 | Source: Ambulatory Visit

## 2023-10-31 ENCOUNTER — Ambulatory Visit (HOSPITAL_COMMUNITY)
Admission: RE | Admit: 2023-10-31 | Discharge: 2023-10-31 | Disposition: A | Discharge status: Home / Self Care | Source: Ambulatory Visit | Attending: Physical Medicine and Rehabilitation | Admitting: Physical Medicine and Rehabilitation

## 2023-10-31 DIAGNOSIS — M50322 Other cervical disc degeneration at C5-C6 level: Secondary | ICD-10-CM | POA: Diagnosis not present

## 2023-10-31 DIAGNOSIS — M5031 Other cervical disc degeneration,  high cervical region: Secondary | ICD-10-CM

## 2023-10-31 DIAGNOSIS — M50323 Other cervical disc degeneration at C6-C7 level: Secondary | ICD-10-CM | POA: Diagnosis not present

## 2023-10-31 DIAGNOSIS — M50321 Other cervical disc degeneration at C4-C5 level: Secondary | ICD-10-CM

## 2023-10-31 DIAGNOSIS — M47812 Spondylosis without myelopathy or radiculopathy, cervical region: Secondary | ICD-10-CM | POA: Diagnosis present

## 2023-11-03 ENCOUNTER — Encounter (INDEPENDENT_AMBULATORY_CARE_PROVIDER_SITE_OTHER): Payer: Self-pay

## 2023-11-13 ENCOUNTER — Ambulatory Visit (INDEPENDENT_AMBULATORY_CARE_PROVIDER_SITE_OTHER)

## 2023-11-13 ENCOUNTER — Encounter (INDEPENDENT_AMBULATORY_CARE_PROVIDER_SITE_OTHER): Payer: Self-pay

## 2023-11-13 VITALS — BP 136/71 | HR 68 | Temp 97.7°F | Ht 67.0 in | Wt 179.0 lb

## 2023-11-13 DIAGNOSIS — H6121 Impacted cerumen, right ear: Secondary | ICD-10-CM | POA: Diagnosis not present

## 2023-11-13 DIAGNOSIS — H9319 Tinnitus, unspecified ear: Secondary | ICD-10-CM

## 2023-11-13 DIAGNOSIS — H903 Sensorineural hearing loss, bilateral: Secondary | ICD-10-CM

## 2023-11-13 DIAGNOSIS — H9313 Tinnitus, bilateral: Secondary | ICD-10-CM

## 2023-11-13 NOTE — Progress Notes (Signed)
 Dear Dr. Curtiss, Here is my assessment for our mutual patient, Douglas Hardy. Thank you for allowing me the opportunity to care for your patient. Please do not hesitate to contact me should you have any other questions. Sincerely, Dr. Hadassah Hardy  Otolaryngology Clinic Note Referring provider: Dr. Curtiss HPI:  Douglas Hardy is a 73 y.o. male kindly referred by Dr. Curtiss for evaluation of chronic worsening tinnitus.   Has had tinnitus for many years  Getting worse On both sides Feels like hearing is a little worse Supposed to get hearing aids soon through the [place] Sometimes feels like he can hear his heartbeat in his ear but this is not the primary source of tinnitus. Mostly hears the heartbeat when his legs are lifted when sitting.    Independent Review of Additional Tests or Records:  Note by Douglas Hardy (10/27/23): pt with worsening tinnitus, constant and bilateral and pulsatile in the left   Audiogram 10/27/23 reviewed: bilateral moderate to severe SNHL downsloping from 2K to 8K.       PMH/Meds/All/SocHx/FamHx/ROS:   Past Medical History:  Diagnosis Date   Arthritis    Chronic back pain    Hypertension    Sleep apnea    uses CPAP     Past Surgical History:  Procedure Laterality Date   FOOT SURGERY     HAND SURGERY     cyst removed-right hand and elbow    left hand surgery     cyst removed from finger   REVERSE SHOULDER ARTHROPLASTY Right 10/25/2018   Procedure: REVERSE SHOULDER ARTHROPLASTY;  Surgeon: Melita Drivers, MD;  Location: [facility];  Service: Orthopedics;  Laterality: Right;    SHOULDER SURGERY     TOTAL THYROIDECTOMY      Family History  Problem Relation Age of Onset   Hyperlipidemia Mother    Hyperlipidemia Father    Hyperlipidemia Sister    Cancer Brother    Stroke Brother    Hyperlipidemia Brother      Social Connections: Unknown (06/21/2021)   Received from [facility]   Social Network    Social Network: Not on file      Current Outpatient Medications  Medication Instructions   acetaminophen  (TYLENOL ) 500 mg, 3 times daily   diclofenac sodium (VOLTAREN) 1 % GEL 1 application , 3 times daily PRN   doxazosin  (CARDURA ) 2 mg, Daily at bedtime   gabapentin  (NEURONTIN ) 600 mg, 3 times daily   HYDROcodone -acetaminophen  (NORCO/VICODIN) 5-325 MG tablet 1 tablet as needed   levothyroxine  (SYNTHROID ) 125 mcg, Daily before breakfast   losartan  (COZAAR ) 100 mg, Daily   Multiple Vitamins-Minerals (SUPER THERA VITE M) TABS Take by mouth.   sertraline (ZOLOFT) 150 mg, Daily at bedtime   simvastatin (ZOCOR) 10 MG tablet TAKE ONE TABLET BY MOUTH AT BEDTIME FOR CHOLESTEROL   traZODone  (DESYREL ) 150 mg, Daily at bedtime   vitamin B-12 (CYANOCOBALAMIN) 500 mcg, Daily     Physical Exam:   BP 136/71 (BP Location: Right Arm, Patient Position: Sitting)   Pulse 68   Temp 97.7 F (36.5 C)   Ht 5' 7 (1.702 m)   Wt 179 lb (81.2 kg)   SpO2 95%   BMI 28.04 kg/m   Salient findings:  CN II-XII intact except for CNVIII hearing loss  Given history and complaints, ear microscopy was indicated and performed for evaluation with findings as below in physical exam section and in procedures Left: EAC was patent. TM was intact . Middle ear was aerated. Drainage:  none Right: EAC with cerumen impaction. This was removed. TM was intact . Middle ear was aerated . Drainage: none No lesions of oral cavity/oropharynx No obviously palpable neck masses/lymphadenopathy/thyromegaly No respiratory distress or stridor  Seprately Identifiable Procedures:  Prior to initiating any procedures, risks/benefits/alternatives were explained to the patient and verbal consent obtained.  Procedure (11/13/2023): Bilateral ear microscopy and cerumen removal using microscope (CPT [phone]) - Mod 25 Pre-procedure diagnosis: right Cerumen impaction   Post-procedure diagnosis: same Indication: right cerumen impaction; given patient's otologic complaints and  history as well as for improved and comprehensive examination of external ear and tympanic membrane, bilateral otologic examination using microscope was performed and impacted cerumen removed  Procedure: Patient was placed semi-recumbent. Both ear canals were examined using the microscope with findings above. Cerumen removed on right using suction and currette with improvement in EAC examination and patency. Left: EAC was patent. TM was intact . Middle ear was aerated. Drainage: none Right: EAC with cerumen impaction. This was removed. TM was intact . Middle ear was aerated . Drainage: none Patient tolerated the procedure well.       Impression & Plans:  Douglas Hardy is a 73 y.o. male with bilateral chronic worsening tinnitus that is mostly constant although at times he can hear his heartbeat in his ear.   Tinnitus Hearing loss - Discussed that hearing aids will likely help - It sounds as though the vast majority of his tinnitus is constant and related to hearing loss. He is planning to get hearing aids soon. This should hopefully help with the tinnitus - Discussed getting imaging to further workup the pulsatile component. Given that it is rare and not bothersome to him we opted to forego further imaging at this time.  - If he notices that the pulsatile component is becoming more frequent or bothersome he should come back to see me and we can further work this up. I am hopeful tinnitus will improve with hearing aids.   Right cerumen impaction - Treated today Discussed that cerumen impactions may worsen with hearing aids. He may need to see an ENT once a year or possibly more often for ear evaluation and possible cerumen removal if cerumen gets packed in the EAC from the hearing aids.  Happy to see him for this and he will call if he would like to schedule at later time    See below regarding exact medications prescribed this encounter including dosages and route: No orders of the defined  types were placed in this encounter.   Thank you for allowing me the opportunity to care for your patient. Please do not hesitate to contact me should you have any other questions.  Sincerely, Douglas Parody, MD Otolaryngologist (ENT), [facility] Phone: [phone] Fax: [phone]

## 2024-01-10 ENCOUNTER — Institutional Professional Consult (permissible substitution) (INDEPENDENT_AMBULATORY_CARE_PROVIDER_SITE_OTHER): Admitting: Physician Assistant

## 2024-01-12 ENCOUNTER — Encounter: Payer: Self-pay | Admitting: Urology

## 2024-01-12 ENCOUNTER — Ambulatory Visit: Payer: Self-pay | Admitting: Urology

## 2024-01-12 VITALS — BP 92/55 | HR 83

## 2024-01-12 DIAGNOSIS — N289 Disorder of kidney and ureter, unspecified: Secondary | ICD-10-CM

## 2024-01-12 NOTE — Patient Instructions (Signed)
 A Growth in the Kidney (Renal Mass): What to Know  A renal mass is an abnormal growth in the kidney. It may be found during an MRI, CT scan, or ultrasound that's done to check for other problems in the belly. Some renal masses are cancerous, or malignant, and can grow or spread quickly. Others are benign, which means they're not cancer. Renal masses include: Tumors. These may be either cancerous or benign. The most common cancerous tumor in adults is called renal cell carcinoma. In children, the most common type is Wilms tumor. The most common kidney tumors that aren't cancer include renal adenomas, oncocytomas, and angiomyolipomas (AML). Cysts. These are pockets of fluid that form on or in the kidney. What are the causes? Certain types of cancers, infections, or injuries can cause a renal mass. It's not always known what causes a cyst to form in or on the kidney. What are the signs or symptoms? Often, a renal mass or kidney cyst doesn't cause any signs or symptoms. How is this diagnosed? Your health care provider may suggest tests to diagnose the cause of your renal mass. These tests may include: A physical exam. Blood tests. Pee (urine) tests. Imaging tests, such as: CT scan. MRI. Ultrasound. Chest X-ray or bone scan. These may be done if a tumor is cancerous to see if the cancer has spread outside the kidney. Biopsy. This is when a small piece of tissue is removed from the renal mass for testing. How is this treated? Treatment is not always needed for a renal mass. Treatment will depend on the cause of the mass and if it's causing any problems or symptoms. For a cancerous renal mass, treatment options may include: Surgery. This is done to remove the tumor and any affected tissue. Chemotherapy. This uses medicines to kill cancer cells. Radiation. High-energy X-rays or gamma rays are used to kill cancer cells. Ablation. This uses extreme hot or cold temperature to kill the cancer  cells. Immunotherapy. Medicines are used to help the body's defense system (immune system) fight the cancer cells. Taking part in clinical trials. This involves trying new or experimental treatments to see if they're effective. Most kidney cysts don't need to be treated. Follow these instructions at home: What you need to do at home will depend on the cause of the mass. Follow the instructions that your provider gives you. In general: Take medicines only as told. If you were given antibiotics, take them as told. Do not stop taking them even if you start to feel better. Follow any instructions from your provider about what you can and can't do. Do not smoke, vape, or use nicotine or tobacco. Keep all follow-up visits. Your provider will need to check if your renal mass has changed or grown. Contact a health care provider if: You have flank pain, which is pain in your side or back. You have a fever. You have a loss of appetite. You have pain or swelling in your belly. You lose weight. Get help right away if: Your pain gets worse. There's blood in your pee. You can't pee. This information is not intended to replace advice given to you by your health care provider. Make sure you discuss any questions you have with your health care provider. Document Revised: 07/22/2022 Document Reviewed: 07/22/2022 Elsevier Patient Education  2025 [employer].

## 2024-01-12 NOTE — Progress Notes (Signed)
 01/12/2024 9:22 AM   Douglas Hardy Jun 21, 1950 [id number]  Referring provider: Toni Marshall SQUIBB, NP [address] [place],  [place] [address]  Right renal mass   HPI: Douglas Hardy is a 73yo here for evaluation of a right renal mass. He underwent MRI at the [place] which showed a right posterior medial 2.5cm right renal mass that had grown since his last MRI 1 year at when it was 2.2cm. Images are not available at todays visit. He denies any significant LUTS. No hematuria. No family history of kidney cancers.    PMH: Past Medical History:  Diagnosis Date   Arthritis    Chronic back pain    Hypertension    Sleep apnea    uses CPAP    Surgical History: Past Surgical History:  Procedure Laterality Date   FOOT SURGERY     HAND SURGERY     cyst removed-right hand and elbow    left hand surgery     cyst removed from finger   REVERSE SHOULDER ARTHROPLASTY Right 10/25/2018   Procedure: REVERSE SHOULDER ARTHROPLASTY;  Surgeon: Melita Drivers, MD;  Location: [facility];  Service: Orthopedics;  Laterality: Right;    SHOULDER SURGERY     TOTAL THYROIDECTOMY      Home Medications:  Allergies as of 01/12/2024       Reactions   Lisinopril Swelling   LIPS   Ace Inhibitors    Other Reaction(s): Angioedema of lips   Celecoxib Rash, Hives   Famotidine     Other Reaction(s): Joint swelling   Pantoprazole     Other Reaction(s): Joint swelling        Medication List        Accurate as of January 12, 2024  9:22 AM. If you have any questions, ask your nurse or doctor.          acetaminophen  500 MG tablet Commonly known as: TYLENOL  Take 500 mg by mouth 3 (three) times daily.   diclofenac sodium 1 % Gel Commonly known as: VOLTAREN Apply 1 application topically 3 (three) times daily as needed (pain).   doxazosin  4 MG tablet Commonly known as: CARDURA  Take 2 mg by mouth at bedtime.   gabapentin  300 MG capsule Commonly known as: NEURONTIN  Take 600 mg by mouth 3 (three) times  daily.   HYDROcodone -acetaminophen  5-325 MG tablet Commonly known as: NORCO/VICODIN 1 tablet as needed   levothyroxine  125 MCG tablet Commonly known as: SYNTHROID  Take 125 mcg by mouth daily before breakfast.   losartan  100 MG tablet Commonly known as: COZAAR  Take 100 mg by mouth daily.   sertraline 100 MG tablet Commonly known as: ZOLOFT Take 150 mg by mouth at bedtime.   simvastatin 10 MG tablet Commonly known as: ZOCOR TAKE ONE TABLET BY MOUTH AT BEDTIME FOR CHOLESTEROL   Super Thera Vite M Tabs Take by mouth.   traZODone  150 MG tablet Commonly known as: DESYREL  Take 150 mg by mouth at bedtime.   vitamin B-12 500 MCG tablet Commonly known as: CYANOCOBALAMIN Take 500 mcg by mouth daily.        Allergies:  Allergies  Allergen Reactions   Lisinopril Swelling    LIPS   Ace Inhibitors     Other Reaction(s): Angioedema of lips   Celecoxib Rash and Hives   Famotidine      Other Reaction(s): Joint swelling   Pantoprazole      Other Reaction(s): Joint swelling    Family History: Family History  Problem Relation Age of Onset  Hyperlipidemia Mother    Hyperlipidemia Father    Hyperlipidemia Sister    Cancer Brother    Stroke Brother    Hyperlipidemia Brother     Social History:  reports that he quit smoking about 50 years ago. His smoking use included cigarettes. He has never used smokeless tobacco. He reports current alcohol use of about 1.0 standard drink of alcohol per week. He reports that he does not use drugs.  ROS: All other review of systems were reviewed and are negative except what is noted above in HPI  Physical Exam: BP (!) 92/55   Pulse 83   Constitutional:  Alert and oriented, No acute distress. HEENT: [place] AT, moist mucus membranes.  Trachea midline, no masses. Cardiovascular: No clubbing, cyanosis, or edema. Respiratory: Normal respiratory effort, no increased work of breathing. GI: Abdomen is soft, nontender, nondistended, no abdominal  masses GU: No CVA tenderness.  Lymph: No cervical or inguinal lymphadenopathy. Skin: No rashes, bruises or suspicious lesions. Neurologic: Grossly intact, no focal deficits, moving all 4 extremities. Psychiatric: Normal mood and affect.  Laboratory Data: Lab Results  Component Value Date   WBC 3.0 (L) 10/18/2021   HGB 11.8 (L) 10/18/2021   HCT 35.1 (L) 10/18/2021   MCV 94 10/18/2021   PLT 111 (L) 10/18/2021    Lab Results  Component Value Date   CREATININE 0.91 10/18/2018    No results found for: PSA  No results found for: TESTOSTERONE  No results found for: HGBA1C  Urinalysis    Component Value Date/Time   COLORURINE AMBER (A) 12/20/2016 1330   APPEARANCEUR CLOUDY (A) 12/20/2016 1330   LABSPEC 1.028 12/20/2016 1330   PHURINE 5.0 12/20/2016 1330   GLUCOSEU NEGATIVE 12/20/2016 1330   HGBUR LARGE (A) 12/20/2016 1330   BILIRUBINUR small (A) 03/16/2023 1224   KETONESUR trace (5) (A) 03/16/2023 1224   KETONESUR NEGATIVE 12/20/2016 1330   PROTEINUR =100 (A) 03/16/2023 1224   PROTEINUR 100 (A) 12/20/2016 1330   UROBILINOGEN 0.2 03/16/2023 1224   UROBILINOGEN 0.2 10/15/2014 1542   NITRITE Negative 03/16/2023 1224   NITRITE POSITIVE (A) 12/20/2016 1330   LEUKOCYTESUR Negative 03/16/2023 1224    Lab Results  Component Value Date   BACTERIA RARE (A) 12/20/2016    Pertinent Imaging:  No results found for this or any previous visit.  No results found for this or any previous visit.  No results found for this or any previous visit.  No results found for this or any previous visit.  No results found for this or any previous visit.  No results found for this or any previous visit.  No results found for this or any previous visit.  No results found for this or any previous visit.   Assessment & Plan:    1. Renal lesion (Primary) We discussed the natural hx of renal masses and the 80/20 malignant/benign likelihood. We disucssed the treatment options  including active surveillance. Renal ablation, partial and radical nephrectomy. We will request MRI disc from [place]. I will also refer him to Interventional radiology for consideration of ablation.    No follow-ups on file.  Belvie Clara, MD  [facility] [place]

## 2024-01-15 ENCOUNTER — Other Ambulatory Visit (HOSPITAL_COMMUNITY): Payer: Self-pay | Admitting: Nurse Practitioner

## 2024-01-15 DIAGNOSIS — R109 Unspecified abdominal pain: Secondary | ICD-10-CM

## 2024-01-23 ENCOUNTER — Other Ambulatory Visit: Payer: Self-pay

## 2024-01-23 DIAGNOSIS — N289 Disorder of kidney and ureter, unspecified: Secondary | ICD-10-CM

## 2024-01-28 ENCOUNTER — Ambulatory Visit (HOSPITAL_COMMUNITY)
Admission: RE | Admit: 2024-01-28 | Discharge: 2024-01-28 | Disposition: A | Discharge status: Home / Self Care | Source: Ambulatory Visit | Attending: Nurse Practitioner | Admitting: Nurse Practitioner

## 2024-01-28 ENCOUNTER — Encounter (HOSPITAL_COMMUNITY): Payer: Self-pay | Admitting: Radiology

## 2024-01-28 DIAGNOSIS — R109 Unspecified abdominal pain: Secondary | ICD-10-CM | POA: Insufficient documentation

## 2024-01-30 ENCOUNTER — Ambulatory Visit
Admission: RE | Admit: 2024-01-30 | Discharge: 2024-01-30 | Disposition: A | Discharge status: Home / Self Care | Source: Ambulatory Visit | Attending: Urology | Admitting: Urology

## 2024-01-30 DIAGNOSIS — N289 Disorder of kidney and ureter, unspecified: Secondary | ICD-10-CM

## 2024-01-30 NOTE — H&P (Signed)
 "      Chief Complaint: Enlarging right renal mass. Patient presents for evaluation  right renal mass biopsy and cryoablation  Referring Physician(s): Hardy,Douglas L  History of Present Illness: Douglas Hardy is a 73 y.o. male 73 y.o male ([place]) outpatient. History of HTN, arthritis. Known right renal mass. Found to be enlarging. MRI from the [place]. showed a right posterior medial 2.5 cm right renal mass. CT Abd pelvis from 12.21.25 pending. Patient presents for possible right renal biopsy with concurrent ablation.    Douglas Hardy denies any flank pain or urinary symptoms. States that the renal mass was an incidental findings 3 or 4 years ago. He is not on anticoagulation.   Past Medical History:  Diagnosis Date   Arthritis    Chronic back pain    Hypertension    Sleep apnea    uses CPAP    Past Surgical History:  Procedure Laterality Date   FOOT SURGERY     HAND SURGERY     cyst removed-right hand and elbow    left hand surgery     cyst removed from finger   REVERSE SHOULDER ARTHROPLASTY Right 10/25/2018   Procedure: REVERSE SHOULDER ARTHROPLASTY;  Surgeon: Douglas Drivers, MD;  Location: [facility];  Service: Orthopedics;  Laterality: Right;    SHOULDER SURGERY     TOTAL THYROIDECTOMY      Allergies: Lisinopril, Ace inhibitors, Celecoxib, Famotidine , and Pantoprazole   Medications: Prior to Admission medications  Medication Sig Start Date End Date Taking? Authorizing Provider  acetaminophen  (TYLENOL ) 500 MG tablet Take 500 mg by mouth 3 (three) times daily.   Yes [provider]  diclofenac sodium (VOLTAREN) 1 % GEL Apply 1 application topically 3 (three) times daily as needed (pain).   Yes [provider]  doxazosin  (CARDURA ) 4 MG tablet Take 2 mg by mouth at bedtime.   Yes [provider]  escitalopram (LEXAPRO) 10 MG tablet Take 10 mg by mouth daily.   Yes [provider]  ferrous sulfate 325 (65 FE) MG tablet Take 325 mg by mouth daily  with breakfast.   Yes [provider]  gabapentin  (NEURONTIN ) 300 MG capsule Take 600 mg by mouth 3 (three) times daily. 07/20/17  Yes [provider]  HYDROcodone -acetaminophen  (NORCO/VICODIN) 5-325 MG tablet 1 tablet as needed   Yes [provider]  levothyroxine  (SYNTHROID ) 125 MCG tablet Take 125 mcg by mouth daily before breakfast.   Yes [provider]  losartan  (COZAAR ) 100 MG tablet Take 100 mg by mouth daily. 08/30/22  Yes [provider]  Multiple Vitamins-Minerals (SUPER THERA VITE M) TABS Take by mouth.   Yes [provider]  simvastatin (ZOCOR) 10 MG tablet TAKE ONE TABLET BY MOUTH AT BEDTIME FOR CHOLESTEROL 03/02/21  Yes [provider]  traZODone  (DESYREL ) 150 MG tablet Take 150 mg by mouth at bedtime. 04/07/22  Yes [provider]  vitamin B-12 (CYANOCOBALAMIN) 500 MCG tablet Take 500 mcg by mouth daily.   Yes [provider]  sertraline (ZOLOFT) 100 MG tablet Take 150 mg by mouth at bedtime. 04/04/22 11/13/23  [provider]     Family History  Problem Relation Age of Onset   Hyperlipidemia Mother    Hyperlipidemia Father    Hyperlipidemia Sister    Cancer Brother    Stroke Brother    Hyperlipidemia Brother     Social History   Socioeconomic History   Marital status: Married    Spouse name: Not on file  Number of children: Not on file   Years of education: Not on file   Highest education level: Not on file  Occupational History   Not on file  Tobacco Use   Smoking status: Former    Current packs/day: 0.00    Average packs/day: 0.5 packs/day    Types: Cigarettes    Quit date: 09/07/1973    Years since quitting: 50.4   Smokeless tobacco: Never  Vaping Use   Vaping status: Never Used  Substance and Sexual Activity   Alcohol use: Yes    Alcohol/week: 1.0 standard drink of alcohol    Types: 1 Glasses of wine per week    Comment: Occ   Drug use: No   Sexual activity: Yes   Other Topics Concern   Not on file  Social History Narrative   Not on file   Social [facility]   Tobacco Use: Medium Risk (01/12/2024)   Patient History    Smoking Tobacco Use: Former    Smokeless Tobacco Use: Never    Passive Exposure: Not on Actuary Strain: Not on file  Food Insecurity: Not on file  Transportation Needs: Not on file  Physical Activity: Not on file  Stress: Not on file  Social Connections: Unknown (06/21/2021)   Received from [facility]   Social Network    Social Network: Not on file  Depression (PHQ2-9): Not on file  Alcohol Screen: Not on file  Housing: Not on file  Utilities: Not on file  Health Literacy: Not on file    Review of Systems: A 12 point ROS discussed and pertinent positives are indicated in the HPI above.  All other systems are negative.  Review of Systems  Constitutional:  Negative for fever.  HENT:  Negative for congestion.   Respiratory:  Negative for cough and shortness of breath.   Cardiovascular:  Negative for chest pain.  Gastrointestinal:  Negative for abdominal pain.  Neurological:  Negative for headaches.  Psychiatric/Behavioral:  Negative for behavioral problems and confusion.     Vital Signs: BP (!) 143/90 (BP Location: Left Arm, Patient Position: Sitting, Cuff Size: Normal)   Pulse 76   Temp 98 F (36.7 C) (Oral)   Resp 16   Wt 177 lb (80.3 kg)   SpO2 97%   BMI 27.72 kg/m   Advance Care Plan: The advanced care plan/surrogate decision maker was discussed at the time of visit and the patient did not wish to discuss or was not able to name a surrogate decision maker or provide an advance care plan.    Physical Exam Vitals and nursing note reviewed.  Constitutional:      Appearance: He is well-developed.  HENT:     Head: Normocephalic.     Mouth/Throat:     Mouth: Mucous membranes are moist.  Pulmonary:     Effort: Pulmonary effort is normal.  Musculoskeletal:     Cervical back:  Normal range of motion.     Comments: Baseline.  Skin:    General: Skin is warm and dry.  Neurological:     General: No focal deficit present.     Mental Status: He is alert and oriented to person, place, and time. Mental status is at baseline.  Psychiatric:        Mood and Affect: Mood normal.        Behavior: Behavior normal.        Thought Content: Thought content normal.  Judgment: Judgment normal.     Mallampati Score:     Imaging: No results found.  Labs:  CBC: No results for input(s): WBC, HGB, HCT, PLT in the last 8760 hours.  COAGS: No results for input(s): INR, APTT in the last 8760 hours.  BMP: No results for input(s): NA, K, CL, CO2, GLUCOSE, BUN, CALCIUM, CREATININE, GFRNONAA, GFRAA in the last 8760 hours.  Invalid input(s): CMP  LIVER FUNCTION TESTS: No results for input(s): BILITOT, AST, ALT, ALKPHOS, PROT, ALBUMIN in the last 8760 hours.  TUMOR MARKERS: No results for input(s): AFPTM, CEA, CA199, CHROMGRNA in the last 8760 hours.  Assessment and Plan:  73 y.o male ([place]) outpatient. History of HTN, arthritis. Known right renal mass. Found to be enlarging. MRI from the [place]. showed a right posterior medial 2.5 cm right renal mass. CT Abd pelvis from 12.21.25 pending. Patient presents today to discuss possible treatment options including  possible right renal biopsy with con concurrent ablation.    1.) Schedule for Right Renal Biopsy and concurrent Cryo Ablation at [facility].  Thank you for this interesting consult.  I greatly enjoyed meeting Douglas Hardy and look forward to participating in their care.  A copy of this report was sent to the requesting provider on this date.  Electronically Signed: Delon JAYSON Beagle 01/30/2024, 8:30 AM   I spent a total of 40 Minutes    in face to face in clinical consultation, greater than 50% of which was counseling/coordinating care for right renal biopsy and  concurrent cryo ablation. 0020 "

## 2024-02-12 ENCOUNTER — Other Ambulatory Visit: Payer: Self-pay | Admitting: Interventional Radiology

## 2024-02-12 DIAGNOSIS — N2889 Other specified disorders of kidney and ureter: Secondary | ICD-10-CM

## 2024-02-13 ENCOUNTER — Encounter
Admission: RE | Admit: 2024-02-13 | Discharge: 2024-02-13 | Disposition: A | Discharge status: Home / Self Care | Source: Ambulatory Visit | Attending: Interventional Radiology | Admitting: Interventional Radiology

## 2024-02-13 ENCOUNTER — Other Ambulatory Visit: Payer: Self-pay

## 2024-02-13 VITALS — Ht 67.0 in | Wt 178.0 lb

## 2024-02-13 DIAGNOSIS — I10 Essential (primary) hypertension: Secondary | ICD-10-CM

## 2024-02-13 HISTORY — DX: Hypothyroidism, unspecified: E03.9

## 2024-02-13 HISTORY — DX: Pure hypercholesterolemia, unspecified: E78.00

## 2024-02-13 HISTORY — DX: Depression, unspecified: F32.A

## 2024-02-13 HISTORY — DX: Prediabetes: R73.03

## 2024-02-13 HISTORY — DX: Anemia, unspecified: D64.9

## 2024-02-13 HISTORY — DX: Other specified disorders of kidney and ureter: N28.89

## 2024-02-13 NOTE — Patient Instructions (Addendum)
 Your procedure is scheduled on: Tuesday 02/20/23 Report to the Registration Desk on the 1st floor of [facility]. Arrive at 7:00 as instructed by your provider.    REMEMBER: Instructions that are not followed completely may result in serious medical risk, up to and including death; or upon the discretion of your surgeon and anesthesiologist your surgery may need to be rescheduled.  Do not eat food or drink fluids after midnight the night before surgery.  No gum chewing or hard candies.   One week prior to surgery: Stop Anti-inflammatories (NSAIDS) such as Advil, Aleve, Ibuprofen, Motrin, Naproxen, Naprosyn and Aspirin based products such as Excedrin, Goody's Powder, BC Powder. Stop ANY OVER THE COUNTER supplements until after surgery. ferrous sulfate 325 (65 FE) MG  Multiple Vitamins-Minerals (SUPER THERA VITE M)  vitamin B-12 (CYANOCOBALAMIN) 500 MCG   You may however, continue to take Tylenol  if needed for pain up until the day of surgery.  Continue taking all of your other prescription medications up until the day of surgery.  ON THE DAY OF SURGERY ONLY TAKE THESE MEDICATIONS WITH SIPS OF WATER :  gabapentin  (NEURONTIN ) 600 MG  levothyroxine  (SYNTHROID ) 125 MCG    No Alcohol for 24 hours before or after surgery.  No Smoking including e-cigarettes for 24 hours before surgery.  No chewable tobacco products for at least 6 hours before surgery.  No nicotine patches on the day of surgery.  Do not use any recreational drugs for at least a week (preferably 2 weeks) before your surgery.  Please be advised that the combination of cocaine and anesthesia may have negative outcomes, up to and including death. If you test positive for cocaine, your surgery will be cancelled.  On the morning of surgery brush your teeth with toothpaste and water , you may rinse your mouth with mouthwash if you wish. Do not swallow any toothpaste or mouthwash.  Use CHG Soap or wipes as directed on  instruction sheet.  Do not wear jewelry, make-up, hairpins, clips or nail polish.  For welded (permanent) jewelry: bracelets, anklets, waist bands, etc.  Please have this removed prior to surgery.  If it is not removed, there is a chance that hospital personnel will need to cut it off on the day of surgery.  Do not wear lotions, powders, or perfumes.   Do not shave body hair from the neck down 48 hours before surgery.  Contact lenses, hearing aids and dentures may not be worn into surgery.  Do not bring valuables to the hospital. [facility] is not responsible for any missing/lost belongings or valuables.   Notify your doctor if there is any change in your medical condition (cold, fever, infection).  Wear comfortable clothing (specific to your surgery type) to the hospital.  After surgery, you can help prevent lung complications by doing breathing exercises.  Take deep breaths and cough every 1-2 hours. Your doctor may order a device called an Incentive Spirometer to help you take deep breaths. When coughing or sneezing, hold a pillow firmly against your incision with both hands. This is called splinting. Doing this helps protect your incision. It also decreases belly discomfort.  If you are being admitted to the hospital overnight, leave your suitcase in the car. After surgery it may be brought to your room.  In case of increased patient census, it may be necessary for you, the patient, to continue your postoperative care in the Same Day Surgery department.  If you are being discharged the day of surgery,  you will not be allowed to drive home. You will need a responsible individual to drive you home and stay with you for 24 hours after surgery.   If you are taking public transportation, you will need to have a responsible individual with you.  Please call the Pre-admissions Testing Dept. at [phone] if you have any questions about these instructions.  Surgery Visitation  Policy:  Patients having surgery or a procedure may have two visitors.  Children under the age of 70 must have an adult with them who is not the patient.  Inpatient Visitation:    Visiting hours are 7 a.m. to 8 p.m. Up to four visitors are allowed at one time in a patient room. The visitors may rotate out with other people during the day.  One visitor age 56 or older may stay with the patient overnight and must be in the room by 8 p.m.   Merchandiser, Retail to address health-related social needs:  [email or web address]                                                                                                            Preparing for Surgery with CHLORHEXIDINE  GLUCONATE (CHG) Soap  Chlorhexidine  Gluconate (CHG) Soap  o An antiseptic cleaner that kills germs and bonds with the skin to continue killing germs even after washing  o Used for showering the night before surgery and morning of surgery  Before surgery, you can play an important role by reducing the number of germs on your skin.  CHG (Chlorhexidine  gluconate) soap is an antiseptic cleanser which kills germs and bonds with the skin to continue killing germs even after washing.  Please do not use if you have an allergy to CHG or antibacterial soaps. If your skin becomes reddened/irritated stop using the CHG.  1. Shower the NIGHT BEFORE SURGERY with CHG soap.  2. If you choose to wash your hair, wash your hair first as usual with your normal shampoo.  3. After shampooing, rinse your hair and body thoroughly to remove the shampoo.  4. Use CHG as you would any other liquid soap. You can apply CHG directly to the skin and wash gently with a clean washcloth.  5. Apply the CHG soap to your body only from the neck down. Do not use on open wounds or open sores. Avoid contact with your eyes, ears, mouth, and genitals (private parts). Wash face and genitals (private parts) with your normal soap.  6. Wash  thoroughly, paying special attention to the area where your surgery will be performed.  7. Thoroughly rinse your body with warm water .  8. Do not shower/wash with your normal soap after using and rinsing off the CHG soap.  9. Do not use lotions, oils, etc., after showering with CHG.  10. Pat yourself dry with a clean towel.  11. Wear clean pajamas to bed the night before surgery.  12. Place clean sheets on your bed the night of your shower and do not sleep with pets.  13. Do not apply  any deodorants/lotions/powders.  14. Please wear clean clothes to the hospital.  15. Remember to brush your teeth with your regular toothpaste.

## 2024-02-14 ENCOUNTER — Encounter
Admission: RE | Admit: 2024-02-14 | Discharge: 2024-02-14 | Disposition: A | Discharge status: Home / Self Care | Source: Ambulatory Visit | Attending: Interventional Radiology | Admitting: Interventional Radiology

## 2024-02-14 DIAGNOSIS — Z01810 Encounter for preprocedural cardiovascular examination: Secondary | ICD-10-CM | POA: Diagnosis present

## 2024-02-14 DIAGNOSIS — I10 Essential (primary) hypertension: Secondary | ICD-10-CM | POA: Diagnosis not present

## 2024-02-14 DIAGNOSIS — Z01812 Encounter for preprocedural laboratory examination: Secondary | ICD-10-CM | POA: Diagnosis present

## 2024-02-14 DIAGNOSIS — Z01818 Encounter for other preprocedural examination: Secondary | ICD-10-CM | POA: Insufficient documentation

## 2024-02-14 LAB — BASIC METABOLIC PANEL WITH GFR
Anion gap: 10 (ref 5–15)
BUN: 21 mg/dL (ref 8–23)
CO2: 30 mmol/L (ref 22–32)
Calcium: 9.6 mg/dL (ref 8.9–10.3)
Chloride: 104 mmol/L (ref 98–111)
Creatinine, Ser: 0.98 mg/dL (ref 0.61–1.24)
GFR, Estimated: >60 mL/min
Glucose, Bld: 88 mg/dL (ref 70–99)
Potassium: 3.8 mmol/L (ref 3.5–5.1)
Sodium: 144 mmol/L (ref 135–145)

## 2024-02-14 LAB — CBC
HCT: 37.7 % — ABNORMAL LOW (ref 39.0–52.0)
Hemoglobin: 12.6 g/dL — ABNORMAL LOW (ref 13.0–17.0)
MCH: 30.4 pg (ref 26.0–34.0)
MCHC: 33.4 g/dL (ref 30.0–36.0)
MCV: 91.1 fL (ref 80.0–100.0)
Platelets: 115 K/uL — ABNORMAL LOW (ref 150–400)
RBC: 4.14 MIL/uL — ABNORMAL LOW (ref 4.22–5.81)
RDW: 14.3 % (ref 11.5–15.5)
WBC: 3.6 K/uL — ABNORMAL LOW (ref 4.0–10.5)
nRBC: 0 % (ref 0.0–0.2)

## 2024-02-15 NOTE — Progress Notes (Signed)
 Douglas Hardy

## 2024-02-16 ENCOUNTER — Ambulatory Visit

## 2024-02-21 ENCOUNTER — Telehealth: Payer: Self-pay

## 2024-02-21 ENCOUNTER — Other Ambulatory Visit (HOSPITAL_COMMUNITY): Payer: Self-pay | Admitting: Radiology

## 2024-02-21 ENCOUNTER — Other Ambulatory Visit: Payer: Self-pay

## 2024-02-21 DIAGNOSIS — N2889 Other specified disorders of kidney and ureter: Secondary | ICD-10-CM

## 2024-02-21 DIAGNOSIS — N289 Disorder of kidney and ureter, unspecified: Secondary | ICD-10-CM

## 2024-02-21 NOTE — Telephone Encounter (Signed)
 Patient for CT RT Renal Cryoablation and Biopsy on Thurs 02/22/2024, I called and spoke with the patient on the phone and gave pre-procedure instructions. Pt was made aware to be here at 7a, NPO after MN prior to procedure as well as driver post procedure/recovery/discharge. Pt stated understanding. Called 02/21/2024 General Anes  Pre-admit appt 02/14/2024

## 2024-02-22 ENCOUNTER — Ambulatory Visit
Admission: RE | Admit: 2024-02-22 | Discharge: 2024-02-22 | Disposition: A | Discharge status: Home / Self Care | Source: Ambulatory Visit

## 2024-02-22 ENCOUNTER — Encounter: Payer: Self-pay | Admitting: Certified Registered"

## 2024-02-22 ENCOUNTER — Ambulatory Visit: Payer: Self-pay | Admitting: Certified Registered"

## 2024-02-22 ENCOUNTER — Ambulatory Visit

## 2024-02-22 ENCOUNTER — Other Ambulatory Visit: Payer: Self-pay

## 2024-02-22 DIAGNOSIS — D869 Sarcoidosis, unspecified: Secondary | ICD-10-CM | POA: Insufficient documentation

## 2024-02-22 DIAGNOSIS — I129 Hypertensive chronic kidney disease with stage 1 through stage 4 chronic kidney disease, or unspecified chronic kidney disease: Secondary | ICD-10-CM | POA: Insufficient documentation

## 2024-02-22 DIAGNOSIS — N2889 Other specified disorders of kidney and ureter: Secondary | ICD-10-CM | POA: Diagnosis present

## 2024-02-22 DIAGNOSIS — N281 Cyst of kidney, acquired: Secondary | ICD-10-CM | POA: Insufficient documentation

## 2024-02-22 DIAGNOSIS — N189 Chronic kidney disease, unspecified: Secondary | ICD-10-CM | POA: Insufficient documentation

## 2024-02-22 DIAGNOSIS — N289 Disorder of kidney and ureter, unspecified: Secondary | ICD-10-CM

## 2024-02-22 LAB — BASIC METABOLIC PANEL WITH GFR
Anion gap: 11 (ref 5–15)
BUN: 19 mg/dL (ref 8–23)
CO2: 28 mmol/L (ref 22–32)
Calcium: 9.8 mg/dL (ref 8.9–10.3)
Chloride: 103 mmol/L (ref 98–111)
Creatinine, Ser: 0.96 mg/dL (ref 0.61–1.24)
GFR, Estimated: >60 mL/min
Glucose, Bld: 97 mg/dL (ref 70–99)
Potassium: 3.4 mmol/L — ABNORMAL LOW (ref 3.5–5.1)
Sodium: 142 mmol/L (ref 135–145)

## 2024-02-22 LAB — TYPE AND SCREEN
ABO/RH(D): A POS
Antibody Screen: NEGATIVE

## 2024-02-22 LAB — CBC WITH DIFFERENTIAL/PLATELET
Abs Immature Granulocytes: 0.01 K/uL (ref 0.00–0.07)
Basophils Absolute: 0 K/uL (ref 0.0–0.1)
Basophils Relative: 1 %
Eosinophils Absolute: 0.1 K/uL (ref 0.0–0.5)
Eosinophils Relative: 3 %
HCT: 39.8 % (ref 39.0–52.0)
Hemoglobin: 13.4 g/dL (ref 13.0–17.0)
Immature Granulocytes: 0 %
Lymphocytes Relative: 21 %
Lymphs Abs: 0.8 K/uL (ref 0.7–4.0)
MCH: 30.2 pg (ref 26.0–34.0)
MCHC: 33.7 g/dL (ref 30.0–36.0)
MCV: 89.8 fL (ref 80.0–100.0)
Monocytes Absolute: 0.4 K/uL (ref 0.1–1.0)
Monocytes Relative: 10 %
Neutro Abs: 2.3 K/uL (ref 1.7–7.7)
Neutrophils Relative %: 65 %
Platelets: 114 K/uL — ABNORMAL LOW (ref 150–400)
RBC: 4.43 MIL/uL (ref 4.22–5.81)
RDW: 13.8 % (ref 11.5–15.5)
WBC: 3.6 K/uL — ABNORMAL LOW (ref 4.0–10.5)
nRBC: 0 % (ref 0.0–0.2)

## 2024-02-22 LAB — PROTIME-INR
INR: 0.9 (ref 0.8–1.2)
Prothrombin Time: 13.2 s (ref 11.4–15.2)

## 2024-02-22 LAB — ABO/RH: ABO/RH(D): A POS

## 2024-02-22 MED ORDER — ACETAMINOPHEN 10 MG/ML IV SOLN: 1000.0000 mg | Freq: Once | INTRAVENOUS | Status: DC | PRN

## 2024-02-22 MED ORDER — OXYCODONE HCL 5 MG PO TABS: 5.0000 mg | ORAL_TABLET | Freq: Once | ORAL | Status: DC | PRN

## 2024-02-22 MED ORDER — ACETAMINOPHEN 10 MG/ML IV SOLN
INTRAVENOUS | Status: DC | PRN
  Administered 2024-02-22: 1000 mg via INTRAVENOUS

## 2024-02-22 MED ORDER — FENTANYL CITRATE (PF) 100 MCG/2ML IJ SOLN
INTRAMUSCULAR | Status: AC
  Filled 2024-02-22: qty 2

## 2024-02-22 MED ORDER — SUGAMMADEX SODIUM 200 MG/2ML IV SOLN: INTRAVENOUS | Status: DC | PRN

## 2024-02-22 MED ORDER — GLYCOPYRROLATE 0.2 MG/ML IJ SOLN
INTRAMUSCULAR | Status: DC | PRN
  Administered 2024-02-22: .2 mg via INTRAVENOUS

## 2024-02-22 MED ORDER — PHENYLEPHRINE HCL-NACL 20-0.9 MG/250ML-% IV SOLN
INTRAVENOUS | Status: DC | PRN
  Administered 2024-02-22: 25 ug/min via INTRAVENOUS

## 2024-02-22 MED ORDER — LIDOCAINE HCL (CARDIAC) PF 100 MG/5ML IV SOSY
PREFILLED_SYRINGE | INTRAVENOUS | Status: DC | PRN
  Administered 2024-02-22: 100 mg via INTRAVENOUS

## 2024-02-22 MED ORDER — ORAL CARE MOUTH RINSE: 15.0000 mL | Freq: Once | OROMUCOSAL | Status: AC

## 2024-02-22 MED ORDER — FENTANYL CITRATE (PF) 50 MCG/ML IJ SOSY: 25.0000 ug | PREFILLED_SYRINGE | INTRAMUSCULAR | Status: DC | PRN

## 2024-02-22 MED ORDER — SUCCINYLCHOLINE CHLORIDE 200 MG/10ML IV SOSY
PREFILLED_SYRINGE | INTRAVENOUS | Status: DC | PRN
  Administered 2024-02-22: 100 mg via INTRAVENOUS

## 2024-02-22 MED ORDER — SENNA 8.6 MG PO TABS
1.0000 | ORAL_TABLET | Freq: Every day | ORAL | Status: DC
  Administered 2024-02-22: 8.6 mg via ORAL
  Filled 2024-02-22 (×2): qty 1

## 2024-02-22 MED ORDER — SODIUM CHLORIDE 0.9 % IV SOLN
2.0000 g | Freq: Once | INTRAVENOUS | Status: AC
  Administered 2024-02-22: 2 g via INTRAVENOUS
  Filled 2024-02-22: qty 2

## 2024-02-22 MED ORDER — LACTATED RINGERS IV SOLN: INTRAVENOUS | Status: DC

## 2024-02-22 MED ORDER — ROCURONIUM BROMIDE 100 MG/10ML IV SOLN
INTRAVENOUS | Status: DC | PRN
  Administered 2024-02-22: 10 mg via INTRAVENOUS
  Administered 2024-02-22: 60 mg via INTRAVENOUS
  Administered 2024-02-22: 10 mg via INTRAVENOUS
  Administered 2024-02-22: 20 mg via INTRAVENOUS

## 2024-02-22 MED ORDER — ESMOLOL HCL 100 MG/10ML IV SOLN
INTRAVENOUS | Status: DC | PRN
  Administered 2024-02-22 (×2): 40 mg via INTRAVENOUS

## 2024-02-22 MED ORDER — HYDROCODONE-ACETAMINOPHEN 5-325 MG PO TABS: 1.0000 | ORAL_TABLET | ORAL | Status: DC | PRN

## 2024-02-22 MED ORDER — SUGAMMADEX SODIUM 200 MG/2ML IV SOLN
INTRAVENOUS | Status: DC | PRN
  Administered 2024-02-22: 300 mg via INTRAVENOUS

## 2024-02-22 MED ORDER — DEXAMETHASONE SOD PHOSPHATE PF 10 MG/ML IJ SOLN
INTRAMUSCULAR | Status: DC | PRN
  Administered 2024-02-22: 20 mg via INTRAVENOUS

## 2024-02-22 MED ORDER — DEXMEDETOMIDINE HCL IN NACL 200 MCG/50ML IV SOLN
INTRAVENOUS | Status: DC | PRN
  Administered 2024-02-22: 12 ug via INTRAVENOUS

## 2024-02-22 MED ORDER — ONDANSETRON HCL 4 MG/2ML IJ SOLN: 4.0000 mg | Freq: Four times a day (QID) | INTRAMUSCULAR | Status: DC | PRN

## 2024-02-22 MED ORDER — FENTANYL CITRATE (PF) 100 MCG/2ML IJ SOLN
INTRAMUSCULAR | Status: DC | PRN
  Administered 2024-02-22 (×2): 50 ug via INTRAVENOUS

## 2024-02-22 MED ORDER — ONDANSETRON HCL 4 MG/2ML IJ SOLN
INTRAMUSCULAR | Status: DC | PRN
  Administered 2024-02-22 (×2): 4 mg via INTRAVENOUS

## 2024-02-22 MED ORDER — PHENYLEPHRINE HCL-NACL 20-0.9 MG/250ML-% IV SOLN
INTRAVENOUS | Status: AC
  Filled 2024-02-22: qty 250

## 2024-02-22 MED ORDER — ACETAMINOPHEN 10 MG/ML IV SOLN
INTRAVENOUS | Status: AC
  Filled 2024-02-22: qty 100

## 2024-02-22 MED ORDER — PHENYLEPHRINE 80 MCG/ML (10ML) SYRINGE FOR IV PUSH (FOR BLOOD PRESSURE SUPPORT)
PREFILLED_SYRINGE | INTRAVENOUS | Status: DC | PRN
  Administered 2024-02-22 (×2): 80 ug via INTRAVENOUS

## 2024-02-22 MED ORDER — CHLORHEXIDINE GLUCONATE 0.12 % MT SOLN
15.0000 mL | Freq: Once | OROMUCOSAL | Status: AC
  Administered 2024-02-22: 15 mL via OROMUCOSAL
  Filled 2024-02-22 (×2): qty 15

## 2024-02-22 MED ORDER — PROPOFOL 10 MG/ML IV BOLUS
INTRAVENOUS | Status: DC | PRN
  Administered 2024-02-22: 160 mg via INTRAVENOUS

## 2024-02-22 MED ORDER — EPHEDRINE SULFATE-NACL 50-0.9 MG/10ML-% IV SOSY
PREFILLED_SYRINGE | INTRAVENOUS | Status: DC | PRN
  Administered 2024-02-22: 10 mg via INTRAVENOUS

## 2024-02-22 MED ORDER — OXYCODONE HCL 5 MG/5ML PO SOLN: 5.0000 mg | Freq: Once | ORAL | Status: DC | PRN

## 2024-02-22 MED ORDER — DROPERIDOL 2.5 MG/ML IJ SOLN: 0.6250 mg | Freq: Once | INTRAMUSCULAR | Status: DC | PRN

## 2024-02-22 MED ORDER — HYDRALAZINE HCL 20 MG/ML IJ SOLN
INTRAMUSCULAR | Status: DC | PRN
  Administered 2024-02-22 (×2): 4 mg via INTRAVENOUS

## 2024-02-22 NOTE — Anesthesia Preprocedure Evaluation (Addendum)
"                                    Anesthesia Evaluation  Patient identified by MRN, date of birth, ID band Patient awake    Reviewed: Allergy & Precautions, H&P , NPO status , Patient's Chart, lab work & pertinent test results, reviewed documented beta blocker date and time   Airway Mallampati: II  TM Distance: >3 FB Neck ROM: full    Dental  (+) Teeth Intact   Pulmonary sleep apnea and Continuous Positive Airway Pressure Ventilation , former smoker   Pulmonary exam normal        Cardiovascular Exercise Tolerance: Poor hypertension, On Medications + DOE  Normal cardiovascular exam Rhythm:regular Rate:Normal     Neuro/Psych  PSYCHIATRIC DISORDERS Anxiety Depression    negative neurological ROS     GI/Hepatic negative GI ROS, Neg liver ROS,,,  Endo/Other  Hypothyroidism    Renal/GU Renal disease  negative genitourinary   Musculoskeletal   Abdominal   Peds  Hematology  (+) Blood dyscrasia, anemia   Anesthesia Other Findings Past Medical History: No date: Anemia No date: Arthritis No date: Chronic back pain No date: Depression No date: Hypercholesteremia No date: Hypertension No date: Hypothyroidism No date: Pre-diabetes No date: Renal mass No date: Sleep apnea     Comment:  uses CPAP Past Surgical History: No date: biopsy of lung  2000: EYE SURGERY     Comment:  Lasix surgery No date: FOOT SURGERY No date: HAND SURGERY     Comment:  cyst removed-right hand and elbow  No date: left hand surgery     Comment:  cyst removed from finger 10/25/2018: REVERSE SHOULDER ARTHROPLASTY; Right     Comment:  Procedure: REVERSE SHOULDER ARTHROPLASTY;  Surgeon:               Melita Drivers, MD;  Location: [facility];  Service:               Orthopedics;  Laterality: Right;  No date: SHOULDER SURGERY No date: TOTAL THYROIDECTOMY   Reproductive/Obstetrics negative OB ROS                              Anesthesia  Physical Anesthesia Plan  ASA: 3  Anesthesia Plan: General ETT   Post-op Pain Management:    Induction:   PONV Risk Score and Plan: 3  Airway Management Planned:   Additional Equipment:   Intra-op Plan:   Post-operative Plan:   Informed Consent: I have reviewed the patients History and Physical, chart, labs and discussed the procedure including the risks, benefits and alternatives for the proposed anesthesia with the patient or authorized representative who has indicated his/her understanding and acceptance.     Dental Advisory Given  Plan Discussed with: CRNA  Anesthesia Plan Comments:         Anesthesia Quick Evaluation  "

## 2024-02-22 NOTE — Transfer of Care (Signed)
 Immediate Anesthesia Transfer of Care Note  Patient: Douglas Hardy  Procedure(s) Performed: CT RENAL TUMOR ABLATION UNILATERAL CT RENAL BIOPSY  Patient Location: PACU  Anesthesia Type:General  Level of Consciousness: awake, drowsy, and patient cooperative  Airway & Oxygen Therapy: Patient Spontanous Breathing and Patient connected to face mask oxygen  Post-op Assessment: Report given to RN and Post -op Vital signs reviewed and stable  Post vital signs: Reviewed and stable  Last Vitals:  Vitals Value Taken Time  BP 152/78 02/22/24 11:15  Temp 36.8 C 02/22/24 11:15  Pulse 87 02/22/24 11:27  Resp 17 02/22/24 11:27  SpO2 97 % 02/22/24 11:27  Vitals shown include unfiled device data.  Last Pain:  Vitals:   02/22/24 1115  TempSrc:   PainSc: 0-No pain         Complications: No notable events documented.

## 2024-02-22 NOTE — Anesthesia Procedure Notes (Signed)
 Procedure Name: Intubation Date/Time: 02/22/2024 8:52 AM  Performed by: Ledora Duncan, CRNAPre-anesthesia Checklist: Patient identified, Emergency Drugs available, Suction available and Patient being monitored Patient Re-evaluated:Patient Re-evaluated prior to induction Oxygen Delivery Method: Circle system utilized Preoxygenation: Pre-oxygenation with 100% oxygen Induction Type: IV induction Ventilation: Mask ventilation without difficulty Laryngoscope Size: McGrath and 3 Grade View: Grade II Tube type: Oral Tube size: 7.0 mm Number of attempts: 1 Airway Equipment and Method: Stylet Placement Confirmation: ETT inserted through vocal cords under direct vision, positive ETCO2 and breath sounds checked- equal and bilateral Secured at: 21 cm Tube secured with: Tape Dental Injury: Teeth and Oropharynx as per pre-operative assessment

## 2024-02-22 NOTE — Anesthesia Postprocedure Evaluation (Signed)
"   Anesthesia Post Note  Patient: Douglas Hardy  Procedure(s) Performed: CT RENAL TUMOR ABLATION UNILATERAL CT RENAL BIOPSY  Patient location during evaluation: PACU Anesthesia Type: General Level of consciousness: awake and alert Pain management: pain level controlled Vital Signs Assessment: post-procedure vital signs reviewed and stable Respiratory status: spontaneous breathing, nonlabored ventilation, respiratory function stable and patient connected to nasal cannula oxygen Cardiovascular status: blood pressure returned to baseline and stable Postop Assessment: no apparent nausea or vomiting Anesthetic complications: no   No notable events documented.   Last Vitals:  Vitals:   02/22/24 1145 02/22/24 1155  BP: 127/69 130/62  Pulse: 84 83  Resp: 13 13  Temp:  36.7 C  SpO2: 93% 93%    Last Pain:  Vitals:   02/22/24 1155  TempSrc: Temporal  PainSc: 0-No pain                 Fairy A [place] Moss      "

## 2024-02-22 NOTE — H&P (Signed)
 "   Chief Complaint:  Enlarging Right Renal Mass  Procedure: Right Renal Mass Biopsy with Cryoablation  Referring Provider(s): Dr. Belvie Clara  Supervising Physician: Jenna Hacker  Patient Status: [facility] - Out-pt  History of Present Illness: Douglas Hardy is a 74 y.o. male with a history of CKD, sarcoidosis, and a known right renal mass which was being followed by his provider at the [facility]. Recent repeat imaging revealed the mass had enlarged slightly over the last year, now measuring 2.5cm from the previous 2.2cm. He was referred to Dr. Clara in Urology to discuss various management options including ablation vs partial/radical nephrectomy. He was subsequently referred to IR where he was evaluated in clinic on 12/23 by Dr. KANDICE Jenna for possible ablation. At that time patient was reporting no flank pain, dysuria, hematuria, or additional abdominal pain. He was deemed an appropriate candidate for biopsy and cryoablation which he elected to pursue over nephrectomy. He presents today with family for his procedure.  Patient is resting in bed in no acute distress. He continues to report no flank pain, urinary symptoms, urinary infections, fevers/chills, abdominal pain, nausea/vomiting, chest pain, shortness of breath, or unintentional weight changes. NPO since midnight. Not on anticoagulation. All questions and concerns answered at the bedside.   Patient is Full Code  Past Medical History:  Diagnosis Date   Anemia    Arthritis    Chronic back pain    Depression    Hypercholesteremia    Hypertension    Hypothyroidism    Pre-diabetes    Renal mass    Sleep apnea    uses CPAP    Past Surgical History:  Procedure Laterality Date   biopsy of lung      EYE SURGERY  2000   Lasix surgery   FOOT SURGERY     HAND SURGERY     cyst removed-right hand and elbow    left hand surgery     cyst removed from finger   REVERSE SHOULDER ARTHROPLASTY Right 10/25/2018   Procedure: REVERSE  SHOULDER ARTHROPLASTY;  Surgeon: Melita Drivers, MD;  Location: [facility];  Service: Orthopedics;  Laterality: Right;    SHOULDER SURGERY     TOTAL THYROIDECTOMY      Allergies: Lisinopril, Ace inhibitors, Celecoxib, Famotidine , and Pantoprazole   Medications: Prior to Admission medications  Medication Sig Start Date End Date Taking? Authorizing Provider  acetaminophen  (TYLENOL ) 500 MG tablet Take 500 mg by mouth 3 (three) times daily.   Yes [provider]  diclofenac sodium (VOLTAREN) 1 % GEL Apply 1 application topically 3 (three) times daily as needed (pain).   Yes [provider]  doxazosin  (CARDURA ) 4 MG tablet Take 2 mg by mouth at bedtime.   Yes [provider]  escitalopram (LEXAPRO) 10 MG tablet Take 10 mg by mouth daily.   Yes [provider]  ferrous sulfate 325 (65 FE) MG tablet Take 325 mg by mouth daily with breakfast.   Yes [provider]  gabapentin  (NEURONTIN ) 300 MG capsule Take 600 mg by mouth 3 (three) times daily. 07/20/17  Yes [provider]  HYDROcodone -acetaminophen  (NORCO/VICODIN) 5-325 MG tablet 1 tablet as needed   Yes [provider]  levothyroxine  (SYNTHROID ) 125 MCG tablet Take 125 mcg by mouth daily before breakfast.   Yes [provider]  losartan  (COZAAR ) 100 MG tablet Take 100 mg by mouth daily. 08/30/22  Yes [provider]  Multiple Vitamins-Minerals (SUPER THERA VITE M) TABS Take by mouth.  Yes [provider]  simvastatin (ZOCOR) 10 MG tablet TAKE ONE TABLET BY MOUTH AT BEDTIME FOR CHOLESTEROL 03/02/21  Yes [provider]  traZODone  (DESYREL ) 150 MG tablet Take 150 mg by mouth at bedtime. 04/07/22  Yes [provider]  vitamin B-12 (CYANOCOBALAMIN) 500 MCG tablet Take 500 mcg by mouth daily.   Yes [provider]  sertraline (ZOLOFT) 100 MG tablet Take 150 mg by mouth at bedtime. 04/04/22 02/13/24  [provider]     Family  History  Problem Relation Age of Onset   Hyperlipidemia Mother    Hyperlipidemia Father    Hyperlipidemia Sister    Cancer Brother    Stroke Brother    Hyperlipidemia Brother     Social History   Socioeconomic History   Marital status: Married    Spouse name: Not on file   Number of children: Not on file   Years of education: Not on file   Highest education level: Not on file  Occupational History   Not on file  Tobacco Use   Smoking status: Former    Current packs/day: 0.00    Average packs/day: 0.5 packs/day    Types: Cigarettes    Quit date: 09/07/1973    Years since quitting: 50.4   Smokeless tobacco: Never  Vaping Use   Vaping status: Never Used  Substance and Sexual Activity   Alcohol use: Yes    Alcohol/week: 1.0 standard drink of alcohol    Types: 1 Glasses of wine per week    Comment: Occ   Drug use: No   Sexual activity: Yes  Other Topics Concern   Not on file  Social History Narrative   Not on file   Social Drivers of Health   Tobacco Use: Medium Risk (02/22/2024)   Patient History    Smoking Tobacco Use: Former    Smokeless Tobacco Use: Never    Passive Exposure: Not on Actuary Strain: Not on file  Food Insecurity: Not on file  Transportation Needs: Not on file  Physical Activity: Not on file  Stress: Not on file  Social Connections: Unknown (06/21/2021)   [facility]   Social Network    Social Network: Not on file  Depression (PHQ2-9): Not on file  Alcohol Screen: Not on file  Housing: Not on file  Utilities: Not on file  Health Literacy: Not on file    Review of Systems Patient denies any headache, chest pain, shortness of breath, abdominal pain, N/V, or fever/chills. All other systems are negative.   Vital Signs: BP (!) 205/118   Pulse 70   Temp 98 F (36.7 C) (Oral)   Resp 12   Ht 5' 7 (1.702 m)   Wt 174 lb 1.6 oz (79 kg)   SpO2 95%   BMI 27.27 kg/m    Physical Exam Vitals reviewed.   Constitutional:      Appearance: Normal appearance.  HENT:     Mouth/Throat:     Mouth: Mucous membranes are moist.     Pharynx: Oropharynx is clear.  Cardiovascular:     Rate and Rhythm: Normal rate and regular rhythm.     Heart sounds: Normal heart sounds.  Pulmonary:     Effort: Pulmonary effort is normal.     Breath sounds: Normal breath sounds.  Abdominal:     General: Abdomen is flat.     Palpations: Abdomen is soft.     Tenderness: There is no abdominal tenderness.  There is no right CVA tenderness or left CVA tenderness.  Skin:    General: Skin is warm and dry.  Neurological:     Mental Status: He is alert and oriented to person, place, and time.  Psychiatric:        Behavior: Behavior normal.     Imaging: CT ABDOMEN PELVIS WO CONTRAST Result Date: 02/10/2024 CLINICAL DATA:  Generalized abdominal pain right-sided low back pain. EXAM: CT ABDOMEN AND PELVIS WITHOUT CONTRAST TECHNIQUE: Multidetector CT imaging of the abdomen and pelvis was performed following the standard protocol without IV contrast. RADIATION DOSE REDUCTION: This exam was performed according to the departmental dose-optimization program which includes automated exposure control, adjustment of the mA and/or kV according to patient size and/or use of iterative reconstruction technique. COMPARISON:  Outside CT 03/16/2023. No report for this study is available FINDINGS: Lower chest: Interlobular septal thickening with ground-glass opacity identified inferior right middle lobe, potentially infectious/inflammatory. 5 mm right lung nodule on image 3/series 8. Hepatobiliary: 15 mm inferior low-density lesion liver adjacent to the gallbladder fossa approaches water  density and is compatible with a cyst, similar to prior. Scattered tiny hypodensities in the liver parenchyma are too small to characterize but are statistically most likely benign. No followup imaging is recommended. There is no evidence for gallstones,  gallbladder wall thickening, or pericholecystic fluid. No intrahepatic or extrahepatic biliary dilation. Pancreas: No focal mass lesion. No dilatation of the main duct. No intraparenchymal cyst. No peripancreatic edema. Spleen: No splenomegaly. No suspicious focal mass lesion. Adrenals/Urinary Tract: No adrenal nodule or mass. 4 cm water  density lesion interpolar left kidney is compatible with a cyst. Several cysts are seen in the right kidney. Tiny well-defined homogeneous low-density lesions in both kidneys are too small to characterize but are statistically most likely benign and probably cysts. No followup imaging is recommended. No evidence for hydroureter. The urinary bladder appears normal for the degree of distention. Stomach/Bowel: Stomach is unremarkable. No gastric wall thickening. No evidence of outlet obstruction. Duodenum is normally positioned as is the ligament of Treitz. No small bowel wall thickening. No small bowel dilatation. The terminal ileum is normal. The appendix is not well visualized, but there is no edema or inflammation in the region of the cecal tip to suggest appendicitis. No gross colonic mass. No colonic wall thickening. Diverticular changes are noted in the left colon without evidence of diverticulitis. Vascular/Lymphatic: There is mild atherosclerotic calcification of the abdominal aorta without aneurysm. There is no gastrohepatic or hepatoduodenal ligament lymphadenopathy. No retroperitoneal or mesenteric lymphadenopathy. No pelvic sidewall lymphadenopathy. Reproductive: The prostate gland and seminal vesicles are unremarkable. Other: No substantial intraperitoneal free fluid. Musculoskeletal: No worrisome lytic or sclerotic osseous abnormality. IMPRESSION: 1. No acute findings in the abdomen or pelvis. Specifically, no findings to explain the patient's history of abdominal pain. 2. Interlobular septal thickening with ground-glass opacity inferior right middle lobe, potentially  infectious/inflammatory. 3. 5 mm right lung nodule. No follow-up needed if patient is low-risk.This recommendation follows the consensus statement: Guidelines for Management of Incidental Pulmonary Nodules Detected on CT Images: From the Fleischner Society 2017; Radiology 2017; 284:228-243. 4. This patient carries a history of right renal mass measuring 2.5 cm in the posteromedial right kidney. On today's study there is a 2.2 cm lesion in the upper interpolar right kidney posteromedially. This cannot be definitively characterized given lack of intravenous contrast on today's study and the lesion approaches water  density. Follow-up abdomen CT with contrast or abdominal MRI with and without contrast recommended  for more definitive characterization. 5.  Aortic Atherosclerosis (ICD10-I70.0). Electronically Signed   By: Camellia Candle M.D.   On: 02/10/2024 08:42    Labs:  CBC: Recent Labs    02/14/24 0823  WBC 3.6*  HGB 12.6*  HCT 37.7*  PLT 115*    COAGS: No results for input(s): INR, APTT in the last 8760 hours.  BMP: Recent Labs    02/14/24 0823  NA 144  K 3.8  CL 104  CO2 30  GLUCOSE 88  BUN 21  CALCIUM 9.6  CREATININE 0.98  GFRNONAA >60    LIVER FUNCTION TESTS: No results for input(s): BILITOT, AST, ALT, ALKPHOS, PROT, ALBUMIN in the last 8760 hours.  TUMOR MARKERS: No results for input(s): AFPTM, CEA, CA199, CHROMGRNA in the last 8760 hours.  Assessment and Plan:  Enlarging Right Renal Mass: Douglas Hardy is a 74 y.o. male with a history of CKD, sarcoidosis, and a known right, enlarging renal mass previously evaluated by Dr. Jenna in clinic and deemed a good candidate for IR procedure. He presents to [facility] Interventional Radiology department today for an image-guided right renal mass biopsy with cryoablation with Dr. KANDICE Jenna. Procedure to be performed under moderate sedation.  Risks and benefits of renal mass biopsy was discussed with the patient  and/or patient's family including, but not limited to bleeding, infection, damage to adjacent structures or low yield requiring additional tests.  Risks and benefits of image guided renal cryoablation was discussed with the patient including, but not limited to, failure to treat entire lesion, bleeding, infection, damage to adjacent structures, hematuria, urine leak, decrease in renal function or post procedural neuropathy.  All of the patient's questions were answered and the patient is agreeable to proceed. Consent signed and in chart.   Thank you for allowing our service to participate in Maciah Schweigert 's care.    Electronically Signed: Kaylyne Axton M Addison Whidbee, PA-C   02/22/2024, 7:42 AM     I spent a total of 15 Minutes in face to face in clinical consultation, greater than 50% of which was counseling/coordinating care for right renal mass biopsy with cryoablation. "

## 2024-02-22 NOTE — Procedures (Signed)
 Pre procedural Dx: Right renal mass  Post procedural Dx: Same  Technically successful CT guided biopsy of right renal mass and 2 Ice Rod cryoablation of right renal mass.  Tract ablation post cryo.   EBL: None.   Complications: None immediate.   KANDICE Banner, MD Pager #: [phone]

## 2024-02-23 LAB — SURGICAL PATHOLOGY
